# Patient Record
Sex: Female | Born: 1955
Health system: Southern US, Community
[De-identification: ages and names within clinical notes are randomized; demographics above are authoritative.]

## PROBLEM LIST (undated history)

## (undated) DIAGNOSIS — Z923 Personal history of irradiation: Secondary | ICD-10-CM

## (undated) DIAGNOSIS — I10 Essential (primary) hypertension: Secondary | ICD-10-CM

## (undated) DIAGNOSIS — C801 Malignant (primary) neoplasm, unspecified: Secondary | ICD-10-CM

## (undated) DIAGNOSIS — E785 Hyperlipidemia, unspecified: Secondary | ICD-10-CM

## (undated) HISTORY — DX: Malignant (primary) neoplasm, unspecified: C80.1

## (undated) HISTORY — PX: BREAST BIOPSY: SHX20

## (undated) HISTORY — PX: CHOLECYSTECTOMY: SHX55

## (undated) HISTORY — DX: Hyperlipidemia, unspecified: E78.5

## (undated) HISTORY — PX: BREAST SURGERY: SHX581

## (undated) HISTORY — DX: Essential (primary) hypertension: I10

---

## 1998-08-07 ENCOUNTER — Ambulatory Visit (HOSPITAL_COMMUNITY): Admission: RE | Admit: 1998-08-07 | Discharge: 1998-08-07 | Payer: Self-pay | Admitting: Obstetrics & Gynecology

## 1998-08-07 ENCOUNTER — Encounter: Payer: Self-pay | Admitting: Obstetrics & Gynecology

## 1998-09-05 DIAGNOSIS — C50919 Malignant neoplasm of unspecified site of unspecified female breast: Secondary | ICD-10-CM

## 1998-09-05 DIAGNOSIS — Z923 Personal history of irradiation: Secondary | ICD-10-CM

## 1998-09-05 HISTORY — DX: Personal history of irradiation: Z92.3

## 1998-09-05 HISTORY — DX: Malignant neoplasm of unspecified site of unspecified female breast: C50.919

## 1998-09-05 HISTORY — PX: BREAST LUMPECTOMY: SHX2

## 1998-10-03 ENCOUNTER — Other Ambulatory Visit: Admission: RE | Admit: 1998-10-03 | Discharge: 1998-10-03 | Payer: Self-pay | Admitting: General Surgery

## 1999-02-24 ENCOUNTER — Ambulatory Visit (HOSPITAL_COMMUNITY): Admission: RE | Admit: 1999-02-24 | Discharge: 1999-02-24 | Payer: Self-pay | Admitting: Obstetrics & Gynecology

## 1999-02-24 ENCOUNTER — Encounter: Payer: Self-pay | Admitting: Obstetrics & Gynecology

## 1999-03-23 ENCOUNTER — Ambulatory Visit (HOSPITAL_COMMUNITY): Admission: RE | Admit: 1999-03-23 | Discharge: 1999-03-23 | Payer: Self-pay | Admitting: General Surgery

## 1999-03-23 ENCOUNTER — Encounter: Payer: Self-pay | Admitting: General Surgery

## 1999-03-23 ENCOUNTER — Encounter (INDEPENDENT_AMBULATORY_CARE_PROVIDER_SITE_OTHER): Payer: Self-pay

## 1999-04-01 ENCOUNTER — Ambulatory Visit (HOSPITAL_BASED_OUTPATIENT_CLINIC_OR_DEPARTMENT_OTHER): Admission: RE | Admit: 1999-04-01 | Discharge: 1999-04-01 | Payer: Self-pay | Admitting: General Surgery

## 1999-04-01 ENCOUNTER — Encounter: Payer: Self-pay | Admitting: General Surgery

## 1999-04-20 ENCOUNTER — Encounter: Admission: RE | Admit: 1999-04-20 | Discharge: 1999-07-19 | Payer: Self-pay | Admitting: Radiation Oncology

## 1999-04-26 ENCOUNTER — Ambulatory Visit (HOSPITAL_COMMUNITY): Admission: RE | Admit: 1999-04-26 | Discharge: 1999-04-26 | Payer: Self-pay | Admitting: Radiation Oncology

## 1999-04-26 ENCOUNTER — Encounter: Payer: Self-pay | Admitting: General Surgery

## 1999-08-06 ENCOUNTER — Other Ambulatory Visit: Admission: RE | Admit: 1999-08-06 | Discharge: 1999-08-06 | Payer: Self-pay | Admitting: Obstetrics and Gynecology

## 1999-09-22 ENCOUNTER — Encounter: Payer: Self-pay | Admitting: General Surgery

## 1999-09-22 ENCOUNTER — Ambulatory Visit (HOSPITAL_COMMUNITY): Admission: RE | Admit: 1999-09-22 | Discharge: 1999-09-22 | Payer: Self-pay | Admitting: General Surgery

## 2000-02-28 ENCOUNTER — Encounter: Payer: Self-pay | Admitting: General Surgery

## 2000-02-28 ENCOUNTER — Encounter: Admission: RE | Admit: 2000-02-28 | Discharge: 2000-02-28 | Payer: Self-pay | Admitting: General Surgery

## 2000-09-12 ENCOUNTER — Encounter: Payer: Self-pay | Admitting: Obstetrics and Gynecology

## 2000-09-12 ENCOUNTER — Encounter: Admission: RE | Admit: 2000-09-12 | Discharge: 2000-09-12 | Payer: Self-pay | Admitting: *Deleted

## 2000-09-28 ENCOUNTER — Other Ambulatory Visit: Admission: RE | Admit: 2000-09-28 | Discharge: 2000-09-28 | Payer: Self-pay | Admitting: Obstetrics and Gynecology

## 2001-09-20 ENCOUNTER — Encounter: Payer: Self-pay | Admitting: Obstetrics and Gynecology

## 2001-09-20 ENCOUNTER — Encounter: Admission: RE | Admit: 2001-09-20 | Discharge: 2001-09-20 | Payer: Self-pay | Admitting: Obstetrics and Gynecology

## 2001-10-17 ENCOUNTER — Other Ambulatory Visit: Admission: RE | Admit: 2001-10-17 | Discharge: 2001-10-17 | Payer: Self-pay | Admitting: Obstetrics and Gynecology

## 2002-09-24 ENCOUNTER — Encounter: Admission: RE | Admit: 2002-09-24 | Discharge: 2002-09-24 | Payer: Self-pay | Admitting: Obstetrics and Gynecology

## 2002-09-24 ENCOUNTER — Encounter: Payer: Self-pay | Admitting: Obstetrics and Gynecology

## 2002-11-14 ENCOUNTER — Other Ambulatory Visit: Admission: RE | Admit: 2002-11-14 | Discharge: 2002-11-14 | Payer: Self-pay | Admitting: Obstetrics and Gynecology

## 2003-02-06 ENCOUNTER — Encounter: Payer: Self-pay | Admitting: Family Medicine

## 2003-02-06 ENCOUNTER — Encounter: Admission: RE | Admit: 2003-02-06 | Discharge: 2003-02-06 | Payer: Self-pay | Admitting: Family Medicine

## 2003-09-30 ENCOUNTER — Encounter: Admission: RE | Admit: 2003-09-30 | Discharge: 2003-09-30 | Payer: Self-pay | Admitting: General Surgery

## 2003-11-18 ENCOUNTER — Other Ambulatory Visit: Admission: RE | Admit: 2003-11-18 | Discharge: 2003-11-18 | Payer: Self-pay | Admitting: Obstetrics and Gynecology

## 2004-09-30 ENCOUNTER — Encounter: Admission: RE | Admit: 2004-09-30 | Discharge: 2004-09-30 | Payer: Self-pay | Admitting: Obstetrics and Gynecology

## 2004-11-30 ENCOUNTER — Other Ambulatory Visit: Admission: RE | Admit: 2004-11-30 | Discharge: 2004-11-30 | Payer: Self-pay | Admitting: Obstetrics and Gynecology

## 2005-02-28 ENCOUNTER — Ambulatory Visit: Payer: Self-pay | Admitting: Oncology

## 2005-10-04 ENCOUNTER — Encounter: Admission: RE | Admit: 2005-10-04 | Discharge: 2005-10-04 | Payer: Self-pay | Admitting: Oncology

## 2006-01-10 ENCOUNTER — Other Ambulatory Visit: Admission: RE | Admit: 2006-01-10 | Discharge: 2006-01-10 | Payer: Self-pay | Admitting: Obstetrics and Gynecology

## 2006-10-05 ENCOUNTER — Encounter: Admission: RE | Admit: 2006-10-05 | Discharge: 2006-10-05 | Payer: Self-pay | Admitting: Obstetrics and Gynecology

## 2006-11-02 ENCOUNTER — Encounter: Admission: RE | Admit: 2006-11-02 | Discharge: 2006-11-02 | Payer: Self-pay | Admitting: Obstetrics and Gynecology

## 2007-10-25 ENCOUNTER — Encounter: Admission: RE | Admit: 2007-10-25 | Discharge: 2007-10-25 | Payer: Self-pay | Admitting: Obstetrics and Gynecology

## 2007-11-09 ENCOUNTER — Encounter: Admission: RE | Admit: 2007-11-09 | Discharge: 2007-11-09 | Payer: Self-pay | Admitting: Obstetrics and Gynecology

## 2008-10-27 ENCOUNTER — Encounter: Admission: RE | Admit: 2008-10-27 | Discharge: 2008-10-27 | Payer: Self-pay | Admitting: Obstetrics and Gynecology

## 2009-07-02 ENCOUNTER — Encounter: Payer: Self-pay | Admitting: Family Medicine

## 2009-07-21 ENCOUNTER — Encounter (INDEPENDENT_AMBULATORY_CARE_PROVIDER_SITE_OTHER): Payer: Self-pay | Admitting: *Deleted

## 2009-07-21 LAB — CONVERTED CEMR LAB
ALT: 17 units/L
AST: 20 units/L
Albumin: 4.6 g/dL
Calcium: 9.8 mg/dL
Cholesterol: 204 mg/dL
Globulin: 2.7 g/dL
HCT: 42.9 %
HDL: 38 mg/dL
Platelets: 316 10*3/uL
Potassium, serum: 4.4 mmol/L
Total Protein: 7.3 g/dL
Triglycerides: 323 mg/dL
WBC, blood: 6 10*3/uL

## 2009-09-25 ENCOUNTER — Ambulatory Visit: Payer: Self-pay | Admitting: Family Medicine

## 2009-09-25 DIAGNOSIS — E7800 Pure hypercholesterolemia, unspecified: Secondary | ICD-10-CM | POA: Insufficient documentation

## 2009-09-25 DIAGNOSIS — I10 Essential (primary) hypertension: Secondary | ICD-10-CM | POA: Insufficient documentation

## 2009-09-25 DIAGNOSIS — D126 Benign neoplasm of colon, unspecified: Secondary | ICD-10-CM

## 2009-09-25 DIAGNOSIS — C50919 Malignant neoplasm of unspecified site of unspecified female breast: Secondary | ICD-10-CM

## 2009-09-25 DIAGNOSIS — E559 Vitamin D deficiency, unspecified: Secondary | ICD-10-CM

## 2009-10-29 ENCOUNTER — Encounter: Admission: RE | Admit: 2009-10-29 | Discharge: 2009-10-29 | Payer: Self-pay | Admitting: Obstetrics and Gynecology

## 2010-01-05 ENCOUNTER — Ambulatory Visit: Payer: Self-pay | Admitting: Family Medicine

## 2010-01-05 LAB — CONVERTED CEMR LAB
Direct LDL: 131.1 mg/dL
HDL: 35.5 mg/dL — ABNORMAL LOW (ref >39.00)

## 2010-04-14 ENCOUNTER — Ambulatory Visit: Payer: Self-pay | Admitting: Family Medicine

## 2010-04-19 LAB — CONVERTED CEMR LAB
Cholesterol: 194 mg/dL (ref 0–200)
Direct LDL: 129.2 mg/dL
HDL: 34.4 mg/dL — ABNORMAL LOW (ref >39.00)
Triglycerides: 262 mg/dL — ABNORMAL HIGH (ref 0.0–149.0)
VLDL: 52.4 mg/dL — ABNORMAL HIGH (ref 0.0–40.0)

## 2010-09-25 ENCOUNTER — Other Ambulatory Visit: Payer: Self-pay | Admitting: Obstetrics and Gynecology

## 2010-09-25 DIAGNOSIS — Z9889 Other specified postprocedural states: Secondary | ICD-10-CM

## 2010-09-26 ENCOUNTER — Encounter: Payer: Self-pay | Admitting: Obstetrics and Gynecology

## 2010-10-05 NOTE — Assessment & Plan Note (Signed)
Summary: 3 MTH FU/CLE   Vital Signs:  Patient profile:   55 year old female Height:      65 inches Weight:      193.4 pounds BMI:     32.30 Temp:     97.9 degrees F oral Pulse rate:   80 / minute Pulse rhythm:   regular BP sitting:   118 / 82  (left arm) Cuff size:   large  Vitals Entered By: Benny Lennert CMA Duncan Dull) (Jan 05, 2010 8:15 AM)  History of Present Illness: Chief complaint 3 month follow up  HTN, improved control..BP at home .Marland Kitchennot checking.  Increased exercsie. Improved diet. Having weigh ins at work...pays 1 dollar for weight gain. 9 lb weight loss.  Using fish oil two times a day.   Problems Prior to Update: 1)  Unspecified Vitamin D Deficiency  (ICD-268.9) 2)  Colonic Polyps  (ICD-211.3) 3)  Hypercholesterolemia  (ICD-272.0) 4)  Essential Hypertension, Benign  (ICD-401.1) 5)  Malig Neoplasm Other Spec Sites Female Breast  (ICD-174.8)  Current Medications (verified): 1)  Omega-3 350 Mg Caps (Omega-3 Fatty Acids) .... Daily 2)  Calcium 500 Mg Tabs (Calcium) .... Daily  Allergies (verified): No Known Drug Allergies  Past History:  Past medical, surgical, family and social histories (including risk factors) reviewed, and no changes noted (except as noted below).  Past Medical History: Reviewed history from 09/25/2009 and no changes required. Current Problems:  COLONIC POLYPS (ICD-211.3) HYPERCHOLESTEROLEMIA (ICD-272.0) ELEVATED BP READING WITHOUT DX HYPERTENSION (ICD-796.2) MALIG NEOPLASM OTHER SPEC SITES FEMALE BREAST (ICD-174.8): Dr. Derrell Lolling surgeon, ? ONC...s/p tamoxifen  Past Surgical History: Reviewed history from 09/25/2009 and no changes required. Gallbladder 1996 breast biopsy 2000 Right kidney surgery 1963, for partial neprectomy for blood vessel issue. right breast -fibroid cyst removed 1985 tubal ligation 1998 right breast cancer with partial lumpectomy with radiation 2000  Family History: Reviewed history from 09/25/2009 and no  changes required. Family History of Arthritis Family History High cholesterol Family History Hypertension Family History of Prostate CA 1st degree relative <50 Mother: prediabetes Father: deceased age 41.Marland KitchenHTN, high chol, prostate cancer Siblings : HTN  Social History: Reviewed history from 09/25/2009 and no changes required. Occupation: Attendance at [employer] Married Never Smoked Alcohol use-no Drug use-no Regular exercise-yes, walks at lunch   Diet: fruits and veggies.   Review of Systems General:  Denies fatigue and fever. CV:  Denies chest pain or discomfort. Resp:  Denies shortness of breath. GI:  Denies abdominal pain. GU:  Denies dysuria.  Physical Exam  General:  Well-developed,well-nourished,in no acute distress; alert,appropriate and cooperative throughout examination Ears:  External ear exam shows no significant lesions or deformities.  Otoscopic examination reveals clear canals, tympanic membranes are intact bilaterally without bulging, retraction, inflammation or discharge. Hearing is grossly normal bilaterally. Mouth:  Oral mucosa and oropharynx without lesions or exudates.  Teeth in good repair. Neck:  no carotid bruit or thyromegaly no cervical or supraclavicular lymphadenopathy  Lungs:  Normal respiratory effort, chest expands symmetrically. Lungs are clear to auscultation, no crackles or wheezes. Heart:  Normal rate and regular rhythm. S1 and S2 normal without gallop, murmur, click, rub or other extra sounds. Pulses:  R and L posterior tibial pulses are full and equal bilaterally  Extremities:  noedema Skin:  Intact without suspicious lesions or rashes   Impression & Recommendations:  Problem # 1:  ESSENTIAL HYPERTENSION, BENIGN (ICD-401.1) Improved. Continue lifestyle change. Follow at home. Call if BP greater than 140/90.   Problem #  2:  HYPERCHOLESTEROLEMIA (ICD-272.0) Due for recheck.  Orders: Venipuncture (52841) TLB-Lipid Panel  ([id number]-LIPID)  Complete Medication List: 1)  Omega-3 350 Mg Caps (Omega-3 fatty acids) .... Daily 2)  Calcium 500 Mg Tabs (Calcium) .... Daily  Patient Instructions: 1)  Please schedule a follow-up appointment in 6-8 months  HTN check.   Current Allergies (reviewed today): No known allergies

## 2010-10-05 NOTE — Assessment & Plan Note (Signed)
Summary: NEW PT TO EST/CLE   Vital Signs:  Patient profile:   55 year old female Height:      65 inches Weight:      202.2 pounds BMI:     33.77 Temp:     97.6 degrees F oral Pulse rate:   76 / minute Pulse rhythm:   regular BP sitting:   140 / 92  (left arm) Cuff size:   large  Vitals Entered By: Benny Lennert CMA Duncan Dull) (September 25, 2009 10:48 AM)  History of Present Illness: Chief complaint new patient to be established   Had been seeing Dr. Vergia Alberts in past.    HTN, inadequate control: new diagnosis.Marland Kitchennoted elevated  152/95at GYN in 06/2009 Nml creatinine, liver, TSH and cbc in 06/2009. no DM on labs as well.     Preventive Screening-Counseling & Management  Alcohol-Tobacco     Smoking Status: never  Caffeine-Diet-Exercise     Does Patient Exercise: yes      Drug Use:  no.    Current Medications (verified): 1)  None  Allergies (verified): No Known Drug Allergies  Past History:  Past Medical History: Current Problems:  COLONIC POLYPS (ICD-211.3) HYPERCHOLESTEROLEMIA (ICD-272.0) ELEVATED BP READING WITHOUT DX HYPERTENSION (ICD-796.2) MALIG NEOPLASM OTHER SPEC SITES FEMALE BREAST (ICD-174.8): Dr. Derrell Lolling surgeon, ? ONC...s/p tamoxifen  Past Surgical History: Gallbladder 1996 breast biopsy 2000 Right kidney surgery 1963, for partial neprectomy for blood vessel issue. right breast -fibroid cyst removed 1985 tubal ligation 1998 right breast cancer with partial lumpectomy with radiation 2000  Family History: Family History of Arthritis Family History High cholesterol Family History Hypertension Family History of Prostate CA 1st degree relative <50 Mother: prediabetes Father: deceased age 24.Marland KitchenHTN, high chol, prostate cancer Siblings : HTN  Social History: Occupation: Attendance at [employer] Married Never Smoked Alcohol use-no Drug use-no Regular exercise-yes, walks at lunch   Diet: fruits and veggies.  Occupation:   employed Smoking Status:  never Drug Use:  no Does Patient Exercise:  yes  Review of Systems       ? snores..no known apnea spells General:  Denies fatigue and fever. CV:  Denies chest pain or discomfort. Resp:  Denies cough and shortness of breath. GI:  Denies bloody stools, constipation, and diarrhea. Psych:  Denies anxiety and depression. Endo:  Denies excessive thirst and weight change.  Physical Exam  General:  obese appearing female inNAd Eyes:  No corneal or conjunctival inflammation noted. EOMI. Perrla. Funduscopic exam benign, without hemorrhages, exudates or papilledema. Vision grossly normal. Ears:  External ear exam shows no significant lesions or deformities.  Otoscopic examination reveals clear canals, tympanic membranes are intact bilaterally without bulging, retraction, inflammation or discharge. Hearing is grossly normal bilaterally. Nose:  External nasal examination shows no deformity or inflammation. Nasal mucosa are pink and moist without lesions or exudates. Mouth:  MMM, samll oropharynx Neck:  no carotid bruit or thyromegaly no cervical or supraclavicular lymphadenopathy  Breasts:  per GYN Lungs:  Normal respiratory effort, chest expands symmetrically. Lungs are clear to auscultation, no crackles or wheezes. Heart:  Normal rate and regular rhythm. S1 and S2 normal without gallop, murmur, click, rub or other extra sounds. Abdomen:  Bowel sounds positive,abdomen soft and non-tender without masses, organomegaly or hernias noted. Genitalia:  per GYN Msk:  No deformity or scoliosis noted of thoracic or lumbar spine.   Pulses:  R and L posterior tibial pulses are full and equal bilaterally  Extremities:  noedema Skin:  Intact without suspicious  lesions or rashes Psych:  Cognition and judgment appear intact. Alert and cooperative with normal attention span and concentration. No apparent delusions, illusions, hallucinations   Impression & Recommendations:  Problem #  1:  HYPERCHOLESTEROLEMIA (ICD-272.0) Triglycerides high..will work on diet changes, exercise and weight loss. Info given.  Problem # 2:  ESSENTIAL HYPERTENSION, BENIGN (ICD-401.1) Assessment: New New diagnosis. Consider sleep apnea eval in future. Wants to try diet changes prior to med initiation. Needs EKG next appt.  UA and lab profile for secondary causes neg at GYN..reviewed in detail.   Patient Instructions: 1)  Increase fish oil to 1000 mg two times a day. 2)   Lipid panel  in 3 months. fasting prior to 3 month follow up 3)  Follow BP at home.Marland Kitchengoal <140/90. 4)  Low salt diet, low fat diet. 5)  Regular exercsie. 6)  Please schedule a follow-up appointment in 3 months .   Flu Vaccine Next Due:  Refused TD Result Date:  09/05/2006 TD Result:  given TD Next Due:  10 yr Flex Sig Next Due:  Not Indicated Colonoscopy Result Date:  03/13/2008 Colonoscopy Result:  polyps, begin Colonoscopy Next Due:  5 yr Hemoccult Next Due:  Not Indicated Mammogram Result Date:  10/06/2008 Mammogram Result:  normal Mammogram Next Due:  1 yr Sees GYN Dr. Senaida Ores GI Dr. Kinnie Scales

## 2010-11-01 ENCOUNTER — Ambulatory Visit
Admission: RE | Admit: 2010-11-01 | Discharge: 2010-11-01 | Disposition: A | Discharge status: Home / Self Care | Payer: 59 | Source: Ambulatory Visit | Attending: Obstetrics and Gynecology | Admitting: Obstetrics and Gynecology

## 2010-11-01 DIAGNOSIS — Z9889 Other specified postprocedural states: Secondary | ICD-10-CM

## 2011-04-25 ENCOUNTER — Encounter: Payer: Self-pay | Admitting: Family Medicine

## 2011-09-22 ENCOUNTER — Other Ambulatory Visit: Payer: Self-pay | Admitting: Obstetrics and Gynecology

## 2011-09-22 DIAGNOSIS — Z1231 Encounter for screening mammogram for malignant neoplasm of breast: Secondary | ICD-10-CM

## 2011-11-16 ENCOUNTER — Ambulatory Visit
Admission: RE | Admit: 2011-11-16 | Discharge: 2011-11-16 | Disposition: A | Discharge status: Home / Self Care | Payer: 59 | Source: Ambulatory Visit | Attending: Obstetrics and Gynecology | Admitting: Obstetrics and Gynecology

## 2011-11-16 DIAGNOSIS — Z1231 Encounter for screening mammogram for malignant neoplasm of breast: Secondary | ICD-10-CM

## 2011-11-21 ENCOUNTER — Other Ambulatory Visit: Payer: Self-pay | Admitting: Obstetrics and Gynecology

## 2011-11-21 DIAGNOSIS — R928 Other abnormal and inconclusive findings on diagnostic imaging of breast: Secondary | ICD-10-CM

## 2011-11-28 ENCOUNTER — Ambulatory Visit
Admission: RE | Admit: 2011-11-28 | Discharge: 2011-11-28 | Disposition: A | Discharge status: Home / Self Care | Payer: 59 | Source: Ambulatory Visit | Attending: Obstetrics and Gynecology | Admitting: Obstetrics and Gynecology

## 2011-11-28 ENCOUNTER — Other Ambulatory Visit: Payer: Self-pay | Admitting: Obstetrics and Gynecology

## 2011-11-28 DIAGNOSIS — R928 Other abnormal and inconclusive findings on diagnostic imaging of breast: Secondary | ICD-10-CM

## 2012-04-26 ENCOUNTER — Telehealth: Payer: Self-pay

## 2012-04-26 NOTE — Telephone Encounter (Signed)
Pt's daughter is not [place] pt; pt goes to [facility] if sick. Pt's daughter who is 66 is in [place] working and when she woke up this morning there was a bat flying around her. Pt's daughter went to doctor in [place] and was advised since exposed to bat but no obvious bite mark suggested pt get rabies treatment. Pts daughter to fly back here 04/27/12. Jadasia wants Dr Daphine Deutscher advise of where she should take her daughter; Lyla Son told [facility] no new patient openings available right away.

## 2012-04-26 NOTE — Telephone Encounter (Signed)
Called patient and left a message on machine:  Advised that standard of care when bat unable to be captured and sent to pathology is for urgent immunoglobulin, followed by rabies vaccination. The only facility that does rabies vaccination follow-up in [facility] is [facility] - and immunoglobulin is given at the ER only in [place].  Relayed that Dr. B back first thing in AM if questions.   Hannah Beat, MD 04/26/2012, 4:55 PM

## 2012-10-31 ENCOUNTER — Other Ambulatory Visit: Payer: Self-pay | Admitting: Obstetrics and Gynecology

## 2012-10-31 ENCOUNTER — Other Ambulatory Visit: Payer: Self-pay

## 2012-10-31 DIAGNOSIS — Z1231 Encounter for screening mammogram for malignant neoplasm of breast: Secondary | ICD-10-CM

## 2012-10-31 DIAGNOSIS — Z780 Asymptomatic menopausal state: Secondary | ICD-10-CM

## 2012-12-18 ENCOUNTER — Ambulatory Visit
Admission: RE | Admit: 2012-12-18 | Discharge: 2012-12-18 | Disposition: A | Discharge status: Home / Self Care | Payer: BC Managed Care – PPO | Source: Ambulatory Visit

## 2012-12-18 ENCOUNTER — Ambulatory Visit
Admission: RE | Admit: 2012-12-18 | Discharge: 2012-12-18 | Disposition: A | Discharge status: Home / Self Care | Payer: BC Managed Care – PPO | Source: Ambulatory Visit | Attending: Obstetrics and Gynecology | Admitting: Obstetrics and Gynecology

## 2012-12-18 DIAGNOSIS — Z780 Asymptomatic menopausal state: Secondary | ICD-10-CM

## 2012-12-18 DIAGNOSIS — Z1231 Encounter for screening mammogram for malignant neoplasm of breast: Secondary | ICD-10-CM

## 2012-12-19 ENCOUNTER — Ambulatory Visit
Admission: RE | Admit: 2012-12-19 | Discharge: 2012-12-19 | Disposition: A | Discharge status: Home / Self Care | Payer: BC Managed Care – PPO | Source: Ambulatory Visit | Attending: Obstetrics and Gynecology | Admitting: Obstetrics and Gynecology

## 2012-12-19 ENCOUNTER — Other Ambulatory Visit: Payer: Self-pay | Admitting: Obstetrics and Gynecology

## 2012-12-19 DIAGNOSIS — R928 Other abnormal and inconclusive findings on diagnostic imaging of breast: Secondary | ICD-10-CM

## 2012-12-26 ENCOUNTER — Ambulatory Visit
Admission: RE | Admit: 2012-12-26 | Discharge: 2012-12-26 | Disposition: A | Discharge status: Home / Self Care | Payer: BC Managed Care – PPO | Source: Ambulatory Visit | Attending: Obstetrics and Gynecology | Admitting: Obstetrics and Gynecology

## 2012-12-26 ENCOUNTER — Other Ambulatory Visit: Payer: Self-pay | Admitting: Obstetrics and Gynecology

## 2012-12-26 DIAGNOSIS — R928 Other abnormal and inconclusive findings on diagnostic imaging of breast: Secondary | ICD-10-CM

## 2013-11-11 ENCOUNTER — Other Ambulatory Visit: Payer: Self-pay

## 2013-11-11 DIAGNOSIS — Z1231 Encounter for screening mammogram for malignant neoplasm of breast: Secondary | ICD-10-CM

## 2013-11-11 DIAGNOSIS — Z9889 Other specified postprocedural states: Secondary | ICD-10-CM

## 2013-12-23 ENCOUNTER — Ambulatory Visit
Admission: RE | Admit: 2013-12-23 | Discharge: 2013-12-23 | Disposition: A | Discharge status: Home / Self Care | Payer: BC Managed Care – PPO | Source: Ambulatory Visit

## 2013-12-23 DIAGNOSIS — Z9889 Other specified postprocedural states: Secondary | ICD-10-CM

## 2013-12-23 DIAGNOSIS — Z1231 Encounter for screening mammogram for malignant neoplasm of breast: Secondary | ICD-10-CM

## 2013-12-26 ENCOUNTER — Other Ambulatory Visit: Payer: Self-pay | Admitting: Obstetrics and Gynecology

## 2013-12-26 DIAGNOSIS — R928 Other abnormal and inconclusive findings on diagnostic imaging of breast: Secondary | ICD-10-CM

## 2014-01-07 ENCOUNTER — Other Ambulatory Visit: Payer: Self-pay | Admitting: Obstetrics and Gynecology

## 2014-01-07 ENCOUNTER — Ambulatory Visit
Admission: RE | Admit: 2014-01-07 | Discharge: 2014-01-07 | Disposition: A | Discharge status: Home / Self Care | Payer: BC Managed Care – PPO | Source: Ambulatory Visit | Attending: Obstetrics and Gynecology | Admitting: Obstetrics and Gynecology

## 2014-01-07 ENCOUNTER — Encounter (INDEPENDENT_AMBULATORY_CARE_PROVIDER_SITE_OTHER): Payer: Self-pay

## 2014-01-07 DIAGNOSIS — R921 Mammographic calcification found on diagnostic imaging of breast: Secondary | ICD-10-CM

## 2014-01-07 DIAGNOSIS — R928 Other abnormal and inconclusive findings on diagnostic imaging of breast: Secondary | ICD-10-CM

## 2014-01-13 ENCOUNTER — Ambulatory Visit
Admission: RE | Admit: 2014-01-13 | Discharge: 2014-01-13 | Disposition: A | Discharge status: Home / Self Care | Payer: BC Managed Care – PPO | Source: Ambulatory Visit | Attending: Obstetrics and Gynecology | Admitting: Obstetrics and Gynecology

## 2014-01-13 DIAGNOSIS — R921 Mammographic calcification found on diagnostic imaging of breast: Secondary | ICD-10-CM

## 2014-11-12 ENCOUNTER — Other Ambulatory Visit: Payer: Self-pay

## 2014-11-12 DIAGNOSIS — Z1231 Encounter for screening mammogram for malignant neoplasm of breast: Secondary | ICD-10-CM

## 2014-11-12 DIAGNOSIS — Z853 Personal history of malignant neoplasm of breast: Secondary | ICD-10-CM

## 2014-11-12 DIAGNOSIS — Z9889 Other specified postprocedural states: Secondary | ICD-10-CM

## 2014-12-02 ENCOUNTER — Encounter: Payer: Self-pay | Admitting: Family Medicine

## 2014-12-02 ENCOUNTER — Ambulatory Visit (INDEPENDENT_AMBULATORY_CARE_PROVIDER_SITE_OTHER): Payer: BC Managed Care – PPO | Admitting: Family Medicine

## 2014-12-02 ENCOUNTER — Encounter (INDEPENDENT_AMBULATORY_CARE_PROVIDER_SITE_OTHER): Payer: Self-pay

## 2014-12-02 ENCOUNTER — Telehealth: Payer: Self-pay | Admitting: Family Medicine

## 2014-12-02 VITALS — BP 146/92 | HR 76 | Temp 98.1°F | Resp 18 | Ht 65.0 in | Wt 196.4 lb

## 2014-12-02 DIAGNOSIS — E559 Vitamin D deficiency, unspecified: Secondary | ICD-10-CM | POA: Diagnosis not present

## 2014-12-02 DIAGNOSIS — E7800 Pure hypercholesterolemia, unspecified: Secondary | ICD-10-CM

## 2014-12-02 DIAGNOSIS — E669 Obesity, unspecified: Secondary | ICD-10-CM

## 2014-12-02 DIAGNOSIS — R7303 Prediabetes: Secondary | ICD-10-CM | POA: Insufficient documentation

## 2014-12-02 DIAGNOSIS — I10 Essential (primary) hypertension: Secondary | ICD-10-CM | POA: Diagnosis not present

## 2014-12-02 DIAGNOSIS — C50911 Malignant neoplasm of unspecified site of right female breast: Secondary | ICD-10-CM

## 2014-12-02 LAB — COMPREHENSIVE METABOLIC PANEL
ALBUMIN: 4.4 g/dL (ref 3.5–5.2)
ALK PHOS: 76 U/L (ref 39–117)
ALT: 15 U/L (ref 0–35)
AST: 15 U/L (ref 0–37)
BUN: 19 mg/dL (ref 6–23)
CHLORIDE: 103 meq/L (ref 96–112)
CO2: 29 mEq/L (ref 19–32)
Calcium: 10.1 mg/dL (ref 8.4–10.5)
Creatinine, Ser: 0.87 mg/dL (ref 0.40–1.20)
GFR: 70.89 mL/min (ref >60.00)
GLUCOSE: 105 mg/dL — AB (ref 70–99)
POTASSIUM: 4.6 meq/L (ref 3.5–5.1)
SODIUM: 138 meq/L (ref 135–145)
TOTAL PROTEIN: 7.7 g/dL (ref 6.0–8.3)
Total Bilirubin: 0.4 mg/dL (ref 0.2–1.2)

## 2014-12-02 LAB — LIPID PANEL
CHOL/HDL RATIO: 5
CHOLESTEROL: 209 mg/dL — AB (ref 0–200)
HDL: 40.7 mg/dL (ref >39.00)
NONHDL: 168.3
TRIGLYCERIDES: 303 mg/dL — AB (ref 0.0–149.0)
VLDL: 60.6 mg/dL — AB (ref 0.0–40.0)

## 2014-12-02 LAB — LDL CHOLESTEROL, DIRECT: LDL DIRECT: 132 mg/dL

## 2014-12-02 LAB — VITAMIN D 25 HYDROXY (VIT D DEFICIENCY, FRACTURES): VITD: 27.18 ng/mL — ABNORMAL LOW (ref 30.00–100.00)

## 2014-12-02 NOTE — Telephone Encounter (Signed)
emmi mailed  °

## 2014-12-02 NOTE — Assessment & Plan Note (Signed)
Due for re-eval. 

## 2014-12-02 NOTE — Addendum Note (Signed)
Addended by: Eliezer Lofts E on: 12/02/2014 09:18 AM   Modules accepted: Orders

## 2014-12-02 NOTE — Progress Notes (Signed)
Pre visit review using our clinic review tool, if applicable. No additional management support is needed unless otherwise documented below in the visit note. 

## 2014-12-02 NOTE — Progress Notes (Signed)
Subjective:    Patient ID: Nicole Meyers, female    DOB: 1956/03/02, 59 y.o.   MRN: [id number]  HPI 59 year old female with history of HTN and high cholesterol presents to establish.  Doing well overall.   Sees GYN, Dr. Marvel Plan 02/2014  Mammo scheduled 12/31/2014  Colonoscopy 2013, Dr. Earlean Shawl, repeat in 5 years.  Hypertension:   Never been on med in past BP Readings from Last 3 Encounters:  12/02/14 146/92  01/05/10 118/82  09/25/09 140/92  Using medication without problems or lightheadedness: None Chest pain with exertion:None Edema:None Short of breath:None Average home BPs: not checking at home. Other issues:  Wt Readings from Last 3 Encounters:  12/02/14 196 lb 6.4 oz (89.086 kg)  01/05/10 193 lb 6.4 oz (87.726 kg)  09/25/09 202 lb 3.2 oz (91.717 kg)   Body mass index is 32.68 kg/(m^2).    Elevated Cholesterol: Never been on med in past.  Likely checked last at GYN. Using medications without problems:on none Diet compliance:Good Exercise: Rare. Other complaints:  History   Social History  . Marital Status: Married    Spouse Name: N/A  . Number of Children: N/A  . Years of Education: N/A   Social History Main Topics  . Smoking status: Never Smoker   . Smokeless tobacco: Not on file  . Alcohol Use: 0.6 oz/week    1 Standard drinks or equivalent per week  . Drug Use: No  . Sexual Activity:    Partners: Male   Other Topics Concern  . None   Social History Narrative    Family History  Problem Relation Age of Onset  . Hypertension Father   . Cancer Father 30    prostate  . Diabetes Mother   . Hypertension Sister   . Hypertension Brother   . Hypertension Sister   . Hypertension Sister   . Hypertension Brother   . Diabetes Maternal Grandmother         Review of Systems  Constitutional: Negative for fever, fatigue and unexpected weight change.  HENT: Negative for congestion, ear pain, sinus pressure, sneezing, sore throat and trouble  swallowing.   Eyes: Negative for pain and itching.  Respiratory: Negative for cough, shortness of breath and wheezing.   Cardiovascular: Negative for chest pain, palpitations and leg swelling.  Gastrointestinal: Negative for nausea, abdominal pain, diarrhea, constipation and blood in stool.  Genitourinary: Negative for dysuria, hematuria, vaginal discharge and difficulty urinating.  Skin: Negative for rash.  Neurological: Negative for syncope, weakness, light-headedness, numbness and headaches.  Psychiatric/Behavioral: Negative for confusion and dysphoric mood. The patient is not nervous/anxious.        Objective:   Physical Exam  Constitutional: Vital signs are normal. She appears well-developed and well-nourished. She is cooperative.  Non-toxic appearance. She does not appear ill. No distress.  HENT:  Head: Normocephalic.  Right Ear: Hearing, tympanic membrane, external ear and ear canal normal.  Left Ear: Hearing, tympanic membrane, external ear and ear canal normal.  Nose: Nose normal.  Eyes: Conjunctivae, EOM and lids are normal. Pupils are equal, round, and reactive to light. Lids are everted and swept, no foreign bodies found.  Neck: Trachea normal and normal range of motion. Neck supple. Carotid bruit is not present. No thyroid mass and no thyromegaly present.  Cardiovascular: Normal rate, regular rhythm, S1 normal, S2 normal, normal heart sounds and intact distal pulses.  Exam reveals no gallop.   No murmur heard. Pulmonary/Chest: Effort normal and breath sounds normal.  No respiratory distress. She has no wheezes. She has no rhonchi. She has no rales.  Abdominal: Soft. Normal appearance and bowel sounds are normal. She exhibits no distension, no fluid wave, no abdominal bruit and no mass. There is no hepatosplenomegaly. There is no tenderness. There is no rebound, no guarding and no CVA tenderness. No hernia.  Lymphadenopathy:    She has no cervical adenopathy.    She has no  axillary adenopathy.  Neurological: She is alert. She has normal strength. No cranial nerve deficit or sensory deficit.  Skin: Skin is warm, dry and intact. No rash noted.  Psychiatric: Her speech is normal and behavior is normal. Judgment normal. Her mood appears not anxious. Cognition and memory are normal. She does not exhibit a depressed mood.          Assessment & Plan:

## 2014-12-02 NOTE — Patient Instructions (Addendum)
Follow blood pressure at home.. Goal < 140/90. Call if > 3 measurements above goal.  Work on healthy eating, weight loss and regular exercise. Stop at lab on way out.

## 2014-12-02 NOTE — Assessment & Plan Note (Signed)
Will work on healthy eating, weight loss and lifestyle cahnge. Follow at home, may need BP med if not to goal.

## 2014-12-03 ENCOUNTER — Encounter: Payer: Self-pay | Admitting: *Deleted

## 2014-12-31 ENCOUNTER — Ambulatory Visit
Admission: RE | Admit: 2014-12-31 | Discharge: 2014-12-31 | Disposition: A | Discharge status: Home / Self Care | Payer: BC Managed Care – PPO | Source: Ambulatory Visit

## 2014-12-31 DIAGNOSIS — Z1231 Encounter for screening mammogram for malignant neoplasm of breast: Secondary | ICD-10-CM

## 2014-12-31 DIAGNOSIS — Z853 Personal history of malignant neoplasm of breast: Secondary | ICD-10-CM

## 2014-12-31 DIAGNOSIS — Z9889 Other specified postprocedural states: Secondary | ICD-10-CM

## 2015-11-16 ENCOUNTER — Other Ambulatory Visit: Payer: Self-pay

## 2015-11-16 DIAGNOSIS — Z1231 Encounter for screening mammogram for malignant neoplasm of breast: Secondary | ICD-10-CM

## 2016-01-04 ENCOUNTER — Ambulatory Visit
Admission: RE | Admit: 2016-01-04 | Discharge: 2016-01-04 | Disposition: A | Discharge status: Home / Self Care | Payer: BC Managed Care – PPO | Source: Ambulatory Visit

## 2016-01-04 DIAGNOSIS — Z1231 Encounter for screening mammogram for malignant neoplasm of breast: Secondary | ICD-10-CM

## 2016-09-27 ENCOUNTER — Encounter: Payer: Self-pay | Admitting: Family Medicine

## 2016-11-09 ENCOUNTER — Other Ambulatory Visit: Payer: Self-pay | Admitting: Obstetrics and Gynecology

## 2016-11-09 DIAGNOSIS — Z1231 Encounter for screening mammogram for malignant neoplasm of breast: Secondary | ICD-10-CM

## 2017-01-09 ENCOUNTER — Ambulatory Visit
Admission: RE | Admit: 2017-01-09 | Discharge: 2017-01-09 | Disposition: A | Discharge status: Home / Self Care | Payer: BC Managed Care – PPO | Source: Ambulatory Visit | Attending: Obstetrics and Gynecology | Admitting: Obstetrics and Gynecology

## 2017-01-09 DIAGNOSIS — Z1231 Encounter for screening mammogram for malignant neoplasm of breast: Secondary | ICD-10-CM

## 2017-01-09 HISTORY — DX: Personal history of irradiation: Z92.3

## 2017-12-01 ENCOUNTER — Other Ambulatory Visit: Payer: Self-pay | Admitting: Obstetrics and Gynecology

## 2017-12-01 DIAGNOSIS — Z139 Encounter for screening, unspecified: Secondary | ICD-10-CM

## 2018-01-10 ENCOUNTER — Ambulatory Visit
Admission: RE | Admit: 2018-01-10 | Discharge: 2018-01-10 | Disposition: A | Discharge status: Home / Self Care | Payer: BC Managed Care – PPO | Source: Ambulatory Visit | Attending: Obstetrics and Gynecology | Admitting: Obstetrics and Gynecology

## 2018-01-10 DIAGNOSIS — Z139 Encounter for screening, unspecified: Secondary | ICD-10-CM

## 2018-10-29 ENCOUNTER — Other Ambulatory Visit: Payer: Self-pay | Admitting: Obstetrics and Gynecology

## 2018-10-29 DIAGNOSIS — Z1231 Encounter for screening mammogram for malignant neoplasm of breast: Secondary | ICD-10-CM

## 2019-01-14 ENCOUNTER — Ambulatory Visit: Payer: BC Managed Care – PPO

## 2019-02-04 ENCOUNTER — Other Ambulatory Visit: Payer: Self-pay

## 2019-02-04 ENCOUNTER — Ambulatory Visit
Admission: RE | Admit: 2019-02-04 | Discharge: 2019-02-04 | Disposition: A | Discharge status: Home / Self Care | Payer: BC Managed Care – PPO | Source: Ambulatory Visit | Attending: Obstetrics and Gynecology | Admitting: Obstetrics and Gynecology

## 2019-02-04 DIAGNOSIS — Z1231 Encounter for screening mammogram for malignant neoplasm of breast: Secondary | ICD-10-CM

## 2019-02-05 ENCOUNTER — Other Ambulatory Visit: Payer: Self-pay | Admitting: Obstetrics and Gynecology

## 2019-02-06 ENCOUNTER — Other Ambulatory Visit: Payer: Self-pay | Admitting: Obstetrics and Gynecology

## 2019-02-06 DIAGNOSIS — R928 Other abnormal and inconclusive findings on diagnostic imaging of breast: Secondary | ICD-10-CM

## 2019-02-18 ENCOUNTER — Other Ambulatory Visit: Payer: Self-pay | Admitting: Obstetrics and Gynecology

## 2019-02-18 ENCOUNTER — Other Ambulatory Visit: Payer: Self-pay

## 2019-02-18 ENCOUNTER — Ambulatory Visit
Admission: RE | Admit: 2019-02-18 | Discharge: 2019-02-18 | Disposition: A | Discharge status: Home / Self Care | Payer: BC Managed Care – PPO | Source: Ambulatory Visit | Attending: Obstetrics and Gynecology | Admitting: Obstetrics and Gynecology

## 2019-02-18 DIAGNOSIS — R928 Other abnormal and inconclusive findings on diagnostic imaging of breast: Secondary | ICD-10-CM

## 2019-05-09 ENCOUNTER — Other Ambulatory Visit: Payer: Self-pay | Admitting: Obstetrics and Gynecology

## 2019-05-09 DIAGNOSIS — E2839 Other primary ovarian failure: Secondary | ICD-10-CM

## 2019-05-14 ENCOUNTER — Other Ambulatory Visit: Payer: BC Managed Care – PPO

## 2019-05-21 ENCOUNTER — Other Ambulatory Visit: Payer: BC Managed Care – PPO

## 2019-05-23 ENCOUNTER — Other Ambulatory Visit: Payer: BC Managed Care – PPO

## 2019-05-23 ENCOUNTER — Ambulatory Visit
Admission: RE | Admit: 2019-05-23 | Discharge: 2019-05-23 | Disposition: A | Discharge status: Home / Self Care | Payer: BC Managed Care – PPO | Source: Ambulatory Visit | Attending: Obstetrics and Gynecology | Admitting: Obstetrics and Gynecology

## 2019-05-23 ENCOUNTER — Other Ambulatory Visit: Payer: Self-pay

## 2019-05-23 DIAGNOSIS — R928 Other abnormal and inconclusive findings on diagnostic imaging of breast: Secondary | ICD-10-CM

## 2019-07-09 ENCOUNTER — Other Ambulatory Visit: Payer: Self-pay

## 2019-07-09 ENCOUNTER — Ambulatory Visit
Admission: RE | Admit: 2019-07-09 | Discharge: 2019-07-09 | Disposition: A | Discharge status: Home / Self Care | Payer: BC Managed Care – PPO | Source: Ambulatory Visit | Attending: Obstetrics and Gynecology | Admitting: Obstetrics and Gynecology

## 2019-07-09 DIAGNOSIS — E2839 Other primary ovarian failure: Secondary | ICD-10-CM

## 2019-12-27 ENCOUNTER — Other Ambulatory Visit: Payer: Self-pay | Admitting: Obstetrics and Gynecology

## 2019-12-27 DIAGNOSIS — Z1231 Encounter for screening mammogram for malignant neoplasm of breast: Secondary | ICD-10-CM

## 2020-02-10 ENCOUNTER — Other Ambulatory Visit: Payer: Self-pay

## 2020-02-10 ENCOUNTER — Ambulatory Visit
Admission: RE | Admit: 2020-02-10 | Discharge: 2020-02-10 | Disposition: A | Discharge status: Home / Self Care | Payer: BC Managed Care – PPO | Source: Ambulatory Visit | Attending: Obstetrics and Gynecology | Admitting: Obstetrics and Gynecology

## 2020-02-10 DIAGNOSIS — Z1231 Encounter for screening mammogram for malignant neoplasm of breast: Secondary | ICD-10-CM

## 2020-11-02 IMAGING — MG DIGITAL SCREENING BILAT W/ TOMO W/ CAD
8 series · 8 of 24 positions shown · non-contrast
Comparison: Previous exam(s).

CLINICAL DATA: Screening. Prior right lumpectomy 7555.

EXAM:
DIGITAL SCREENING BILATERAL MAMMOGRAM WITH TOMO AND CAD

[L CC synth-2D]
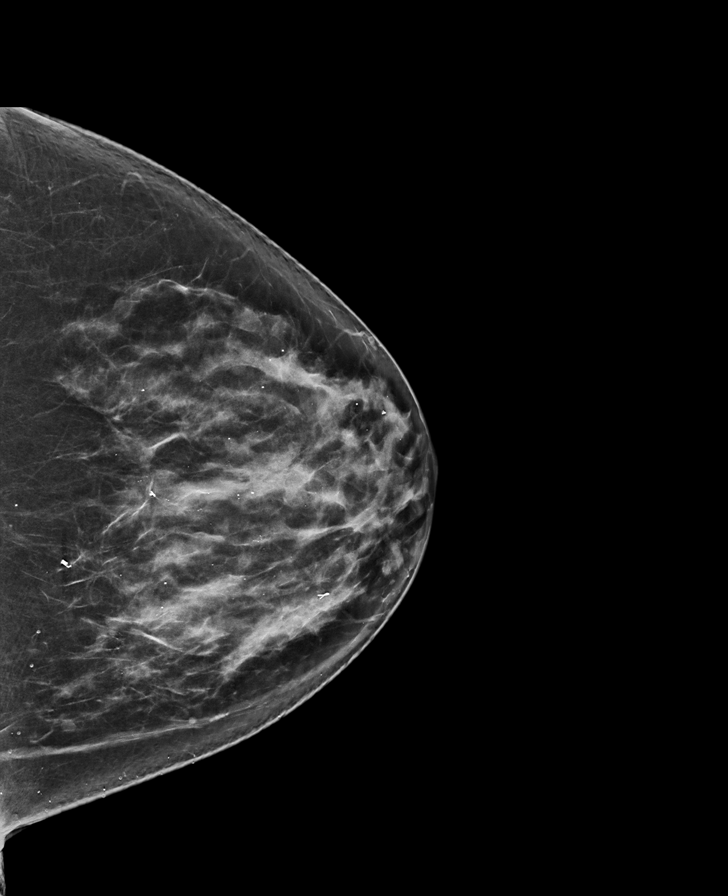

[L MLO synth-2D]
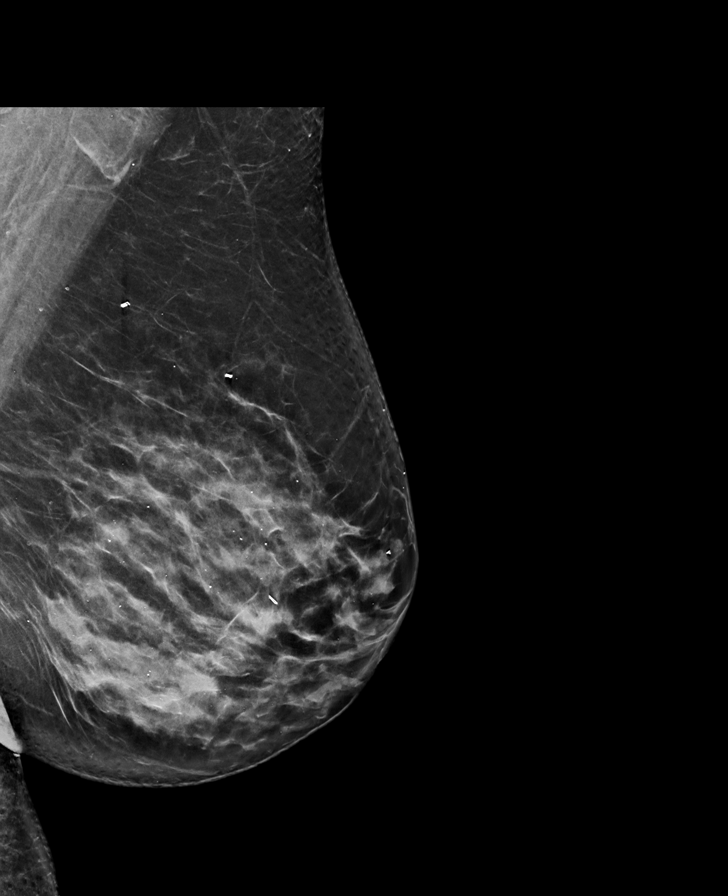

[R CC synth-2D]
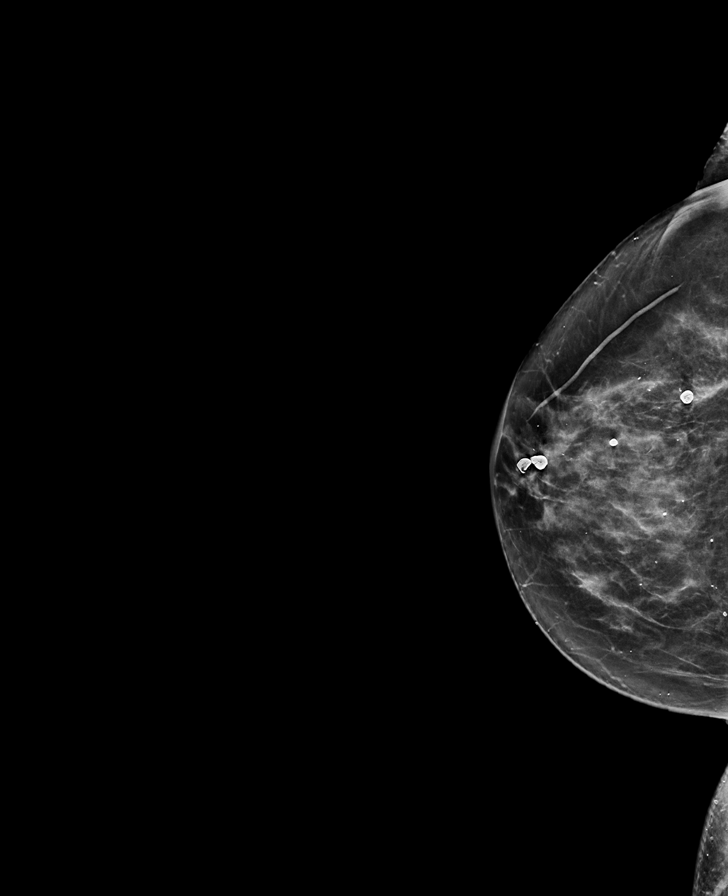

[R MLO synth-2D]
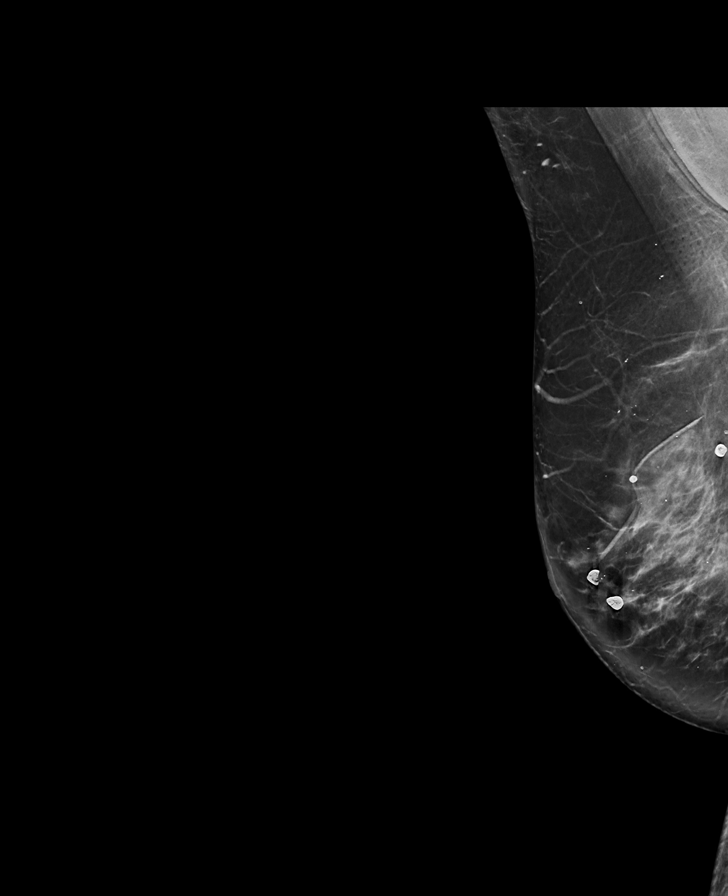

[L CC tomo · tomo slice 40/79.0]
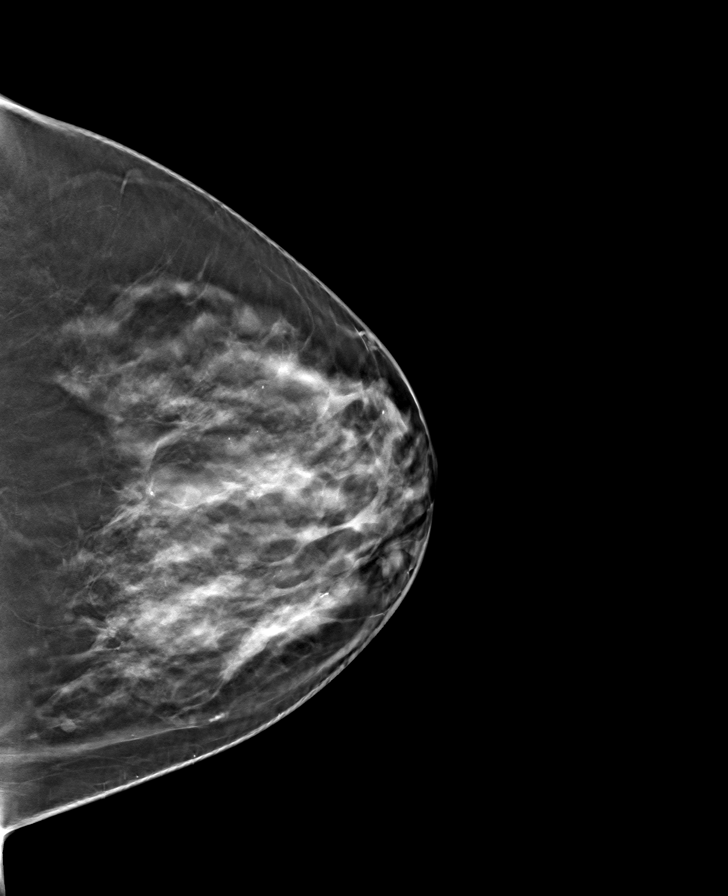

[R CC tomo · tomo slice 33/65.0]
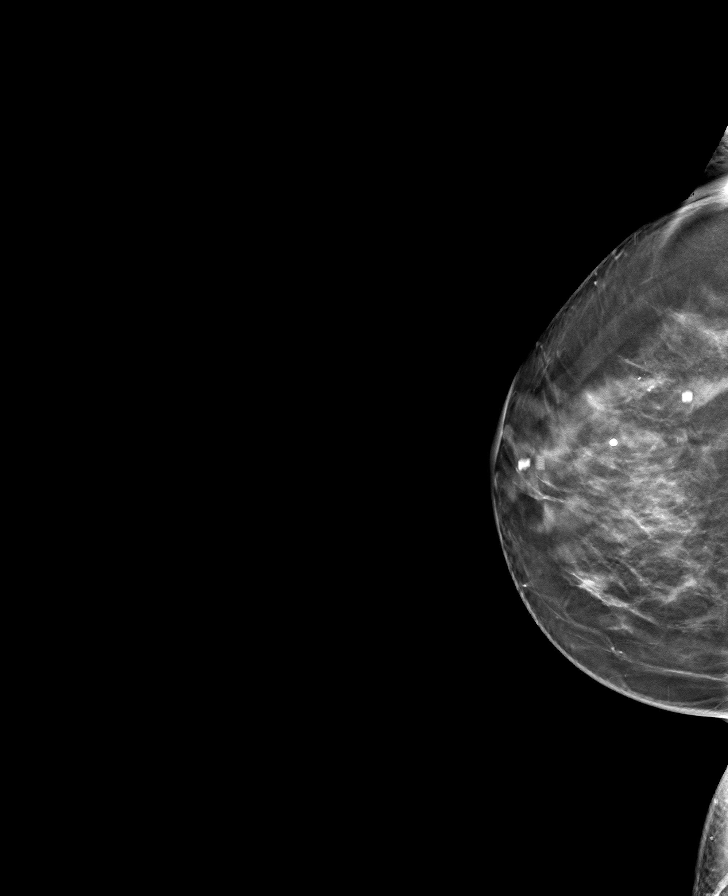

[R MLO tomo · tomo slice 39/77.0]
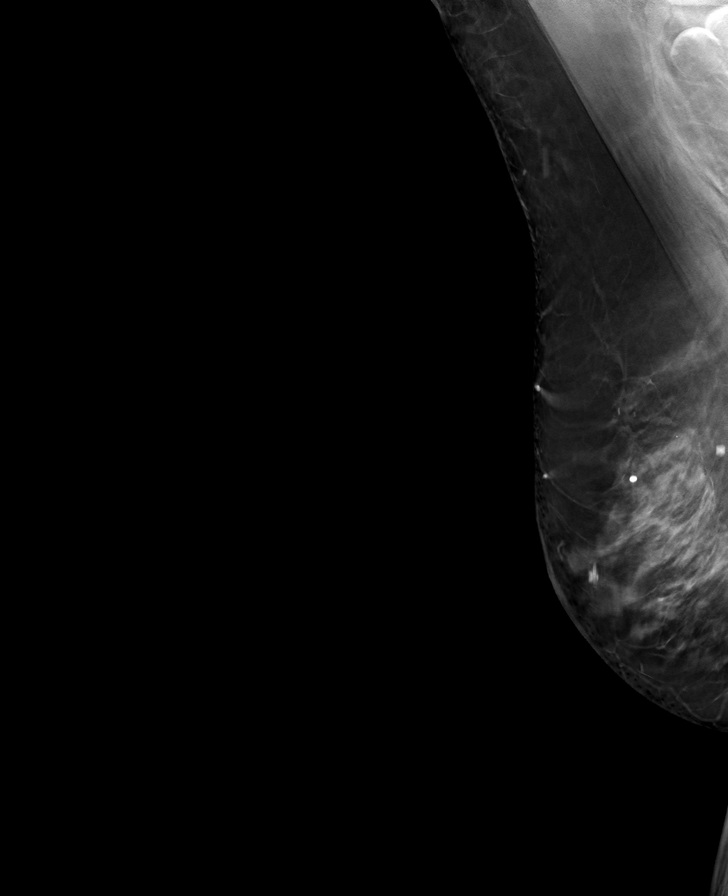

[L MLO tomo · tomo slice 43/86.0]
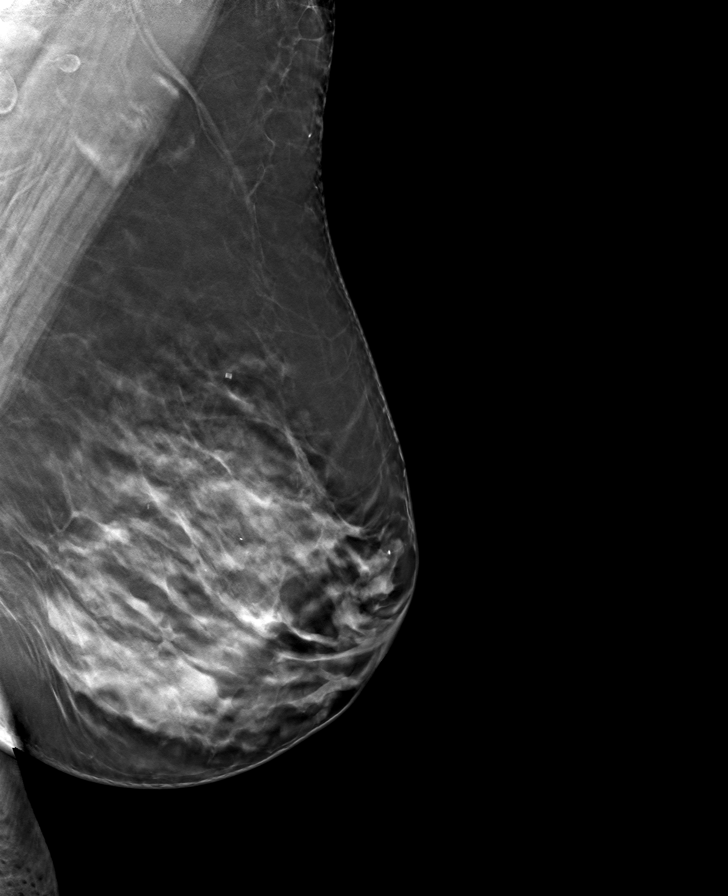

[8 of 24 positions shown; findings below may reference images not displayed]

ACR Breast Density Category c: The breast tissue is heterogeneously
dense, which may obscure small masses.
FINDINGS: There are no findings suspicious for malignancy. Images were
processed with CAD.
IMPRESSION: No mammographic evidence of malignancy. A result letter of this
screening mammogram will be mailed directly to the patient.

RECOMMENDATION:
Screening mammogram in one year. (Code:T3-9-XSR)

BI-RADS CATEGORY  1: Negative.

## 2020-12-04 ENCOUNTER — Other Ambulatory Visit: Payer: Self-pay | Admitting: Obstetrics and Gynecology

## 2020-12-04 DIAGNOSIS — Z1231 Encounter for screening mammogram for malignant neoplasm of breast: Secondary | ICD-10-CM

## 2021-02-10 ENCOUNTER — Other Ambulatory Visit: Payer: Self-pay

## 2021-02-10 ENCOUNTER — Ambulatory Visit
Admission: RE | Admit: 2021-02-10 | Discharge: 2021-02-10 | Disposition: A | Discharge status: Home / Self Care | Payer: Medicare Other | Source: Ambulatory Visit | Attending: Obstetrics and Gynecology | Admitting: Obstetrics and Gynecology

## 2021-02-10 DIAGNOSIS — Z1231 Encounter for screening mammogram for malignant neoplasm of breast: Secondary | ICD-10-CM

## 2021-02-12 ENCOUNTER — Other Ambulatory Visit: Payer: Self-pay | Admitting: Obstetrics and Gynecology

## 2021-02-12 DIAGNOSIS — R928 Other abnormal and inconclusive findings on diagnostic imaging of breast: Secondary | ICD-10-CM

## 2021-02-16 ENCOUNTER — Ambulatory Visit
Admission: RE | Admit: 2021-02-16 | Discharge: 2021-02-16 | Disposition: A | Discharge status: Home / Self Care | Payer: Medicare Other | Source: Ambulatory Visit | Attending: Obstetrics and Gynecology | Admitting: Obstetrics and Gynecology

## 2021-02-16 ENCOUNTER — Other Ambulatory Visit: Payer: Self-pay

## 2021-02-16 ENCOUNTER — Other Ambulatory Visit: Payer: Self-pay | Admitting: Obstetrics and Gynecology

## 2021-02-16 DIAGNOSIS — R921 Mammographic calcification found on diagnostic imaging of breast: Secondary | ICD-10-CM

## 2021-02-16 DIAGNOSIS — R928 Other abnormal and inconclusive findings on diagnostic imaging of breast: Secondary | ICD-10-CM

## 2021-02-19 ENCOUNTER — Ambulatory Visit
Admission: RE | Admit: 2021-02-19 | Discharge: 2021-02-19 | Disposition: A | Discharge status: Home / Self Care | Payer: Medicare Other | Source: Ambulatory Visit | Attending: Obstetrics and Gynecology | Admitting: Obstetrics and Gynecology

## 2021-02-19 ENCOUNTER — Other Ambulatory Visit: Payer: Self-pay | Admitting: Obstetrics and Gynecology

## 2021-02-19 ENCOUNTER — Other Ambulatory Visit: Payer: Self-pay

## 2021-02-19 DIAGNOSIS — R921 Mammographic calcification found on diagnostic imaging of breast: Secondary | ICD-10-CM

## 2021-03-09 ENCOUNTER — Ambulatory Visit: Payer: Self-pay | Admitting: General Surgery

## 2021-03-09 DIAGNOSIS — D0511 Intraductal carcinoma in situ of right breast: Secondary | ICD-10-CM

## 2021-03-10 ENCOUNTER — Telehealth: Payer: Self-pay | Admitting: Hematology and Oncology

## 2021-03-10 NOTE — Telephone Encounter (Signed)
Received a new pt referral from Dr. Marlou Starks for a new dx of breast cancer. Nicole Meyers has been cld and scheduled to see Dr. Chryl Heck on 7/13 at 10am followed by her appt w/dr. Sondra Come.

## 2021-03-12 NOTE — Progress Notes (Signed)
Location of Breast Cancer: right breast  Histology per Pathology Report:  02/19/2021   Receptor Status:    Did patient present with symptoms (if so, please note symptoms) or was this found on screening mammography?: screening mammography  Past/Anticipated interventions by surgeon, if any:     Past/Anticipated interventions by medical oncology, if any: Has appointment with Dr Chryl Heck 03/17/2021  Lymphedema issues, if any:  no    Pain issues, if any:  no   SAFETY ISSUES: Prior radiation? yes, right breast 2000 Pacemaker/ICD? no Possible current pregnancy? no, postmenopausal Is the patient on methotrexate? no  Current Complaints / other details:  none     Vitals:   03/17/21 0740  BP: (!) 142/80  Pulse: 71  Resp: 20  Temp: 97.6 F (36.4 C)  SpO2: 99%  Weight: 189 lb 6.4 oz (85.9 kg)  Height: 5\' 5"  (1.651 m)

## 2021-03-15 NOTE — Progress Notes (Signed)
Radiation Oncology         [phone] ________________________________  Initial Outpatient Consultation  Name: Nicole Meyers MRN: [id number]  Date: 03/17/2021  DOB: 1956/08/03  [id number], Mervyn Gay, MD  Jovita Kussmaul, MD   REFERRING PHYSICIAN: Autumn Messing III, MD  DIAGNOSIS: The encounter diagnosis was Ductal carcinoma in situ (DCIS) of right breast.  Right Breast UOQ, Intermediate Grade DCIS, ER+ / PR+    HISTORY OF PRESENT ILLNESS::Nicole Meyers is a 65 y.o. female who is accompanied by no one. she is seen as a courtesy of Dr. Marlou Starks for an opinion concerning radiation therapy as part of management for her recently diagnosed right breast cancer. The patient presented to [facility] on 02/10/21  for a routine screening mammogram which showed possible calcification in the right breast, warranting further evaluation.  Diagnostic mammogram of the right breast performed on 02/16/21 revealed a 1.7 x 1.4 x 1.8 cm cluster of calcifications in the upper outer quadrant of the right breast.  Biopsy performed on 02/19/21 revealed: an intermediate grade ductal carcinoma in situ with necrosis and calcifications of the right breast (upper outer quadrant). Prognostic indicators significant for: estrogen receptor, 90% positive with strong staining intensity, and progesterone receptor, 30% positive with moderate staining intensity.   The patient last followed up Dr. Marlou Starks on 03/09/21 in which plans were discussed of right mastectomy and sentinel node biopsy. The patient expressed agreement to this plan.  She denied any breast pain or discharge from the nipple at the time of this visit, and was otherwise noted to be of good health.     PREVIOUS RADIATION THERAPY: Yes: History of ductal carcinoma in situ of the right breast about 22 years ago that was treated with lumpectomy, radiation, and tamoxifen.  She received her radiation therapy under the direction of Dr. Arloa Koh.  Her surgeon at  that time was Fanny Skates.  She reports having approximately 6 and half weeks of radiation therapy.  She reports tolerating this treatment well without any significant after effects.  She reports having her radiation therapy at [facility] HISTORY:  Past Medical History:  Diagnosis Date   Breast cancer ([place]) 2000   Right Breast Cancer   Cancer ([place])    Breast   Hyperlipidemia    Hypertension    Personal history of radiation therapy 2000   Right Breast Cancer    PAST SURGICAL HISTORY: Past Surgical History:  Procedure Laterality Date   BREAST LUMPECTOMY Right 2000   BREAST SURGERY     2000-Lumpectomy   CHOLECYSTECTOMY     1996    FAMILY HISTORY:  Family History  Problem Relation Age of Onset   Hypertension Father    Cancer Father 33       prostate   Diabetes Mother    Hypertension Sister    Hypertension Brother    Hypertension Sister    Hypertension Sister    Hypertension Brother    Diabetes Maternal Grandmother    Breast cancer Neg Hx     SOCIAL HISTORY:  Social History   Tobacco Use   Smoking status: Never   Smokeless tobacco: Never  Substance Use Topics   Alcohol use: Yes    Alcohol/week: 1.0 standard drink    Types: 1 Standard drinks or equivalent per week   Drug use: No    ALLERGIES:  Allergies  Allergen Reactions   No Known Allergies     MEDICATIONS:  Current Outpatient  Medications  Medication Sig Dispense Refill   calcium carbonate (OS-CAL) 600 MG TABS tablet Take 600 mg by mouth daily with breakfast.     CHIA SEED PO Take by mouth daily.     Cholecalciferol (VITAMIN D3) 2000 UNITS TABS Take 1 tablet by mouth daily.     Multiple Vitamin (MULTIVITAMIN) tablet Take 1 tablet by mouth daily.     Omega-3 Fatty Acids (OMEGA 3 PO) Take 1 capsule by mouth daily.     potassium gluconate 595 MG TABS tablet Take 595 mg by mouth daily.     Thiamine HCl (VITAMIN B-1) 250 MG tablet Take 250 mg by mouth daily.     vitamin E 400 UNIT  capsule Take 400 Units by mouth daily.     No current facility-administered medications for this encounter.    REVIEW OF SYSTEMS:  A 10+ POINT REVIEW OF SYSTEMS WAS OBTAINED including neurology, dermatology, psychiatry, cardiac, respiratory, lymph, extremities, GI, GU, musculoskeletal, constitutional, reproductive, HEENT.  She denies any pain within the breast area nipple discharge or bleeding prior to most recent biopsy and diagnosis.  She denies any new bony pain headaches dizziness or blurred vision.   PHYSICAL EXAM:  height is 5\' 5"  (1.651 m) and weight is 189 lb 6.4 oz (85.9 kg). Her temperature is 97.6 F (36.4 C). Her blood pressure is 142/80 (abnormal) and her pulse is 71. Her respiration is 20 and oxygen saturation is 99%.   General: Alert and oriented, in no acute distress HEENT: Head is normocephalic. Extraocular movements are intact.  Neck: Neck is supple, no palpable cervical or supraclavicular lymphadenopathy. Heart: Regular in rate and rhythm with no murmurs, rubs, or gallops. Chest: Clear to auscultation bilaterally, with no rhonchi, wheezes, or rales. Abdomen: Soft, nontender, nondistended, with no rigidity or guarding. Extremities: No cyanosis or edema. Lymphatics: see Neck Exam Skin: No concerning lesions. Musculoskeletal: symmetric strength and muscle tone throughout. Neurologic: Cranial nerves II through XII are grossly intact. No obvious focalities. Speech is fluent. Coordination is intact. Psychiatric: Judgment and insight are intact. Affect is appropriate. Left breast examination shows no palpable mass nipple discharge or bleeding.  Examination of the right breast reveals this breast at the somewhat smaller than the left consistent with prior surgery and radiation effect.  Tattoos noted along the chest area from her previous radiation treatments.  A faint lateral scar is noted at approximately the 9 o'clock position of the right breast.  She has a small biopsy site in  approximately the 12 o'clock position of the breast.  ECOG = 0  0 - Asymptomatic (Fully active, able to carry on all predisease activities without restriction)  1 - Symptomatic but completely ambulatory (Restricted in physically strenuous activity but ambulatory and able to carry out work of a light or sedentary nature. For example, light housework, office work)  2 - Symptomatic, <50% in bed during the day (Ambulatory and capable of all self care but unable to carry out any work activities. Up and about more than 50% of waking hours)  3 - Symptomatic, >50% in bed, but not bedbound (Capable of only limited self-care, confined to bed or chair 50% or more of waking hours)  4 - Bedbound (Completely disabled. Cannot carry on any self-care. Totally confined to bed or chair)  5 - Death   Eustace Pen MM, Creech RH, Tormey DC, et al. [phone]). "Toxicity and response criteria of the [facility]". [place] Oncol. 5 (6): 649-55  LABORATORY DATA:  Lab Results  Component Value Date   HGB 14.4 07/21/2009   HCT 42.9 07/21/2009   MCV 88 07/21/2009   PLT 316 07/21/2009   Lab Results  Component Value Date   NA 138 12/02/2014   K 4.6 12/02/2014   CL 103 12/02/2014   CO2 29 12/02/2014   GLUCOSE 105 (H) 12/02/2014   CREATININE 0.87 12/02/2014   CALCIUM 10.1 12/02/2014      RADIOGRAPHY: MM Digital Diagnostic Unilat R  Result Date: 02/16/2021 CLINICAL DATA:  Patient returns after screening study for evaluation of RIGHT breast calcifications. History of RIGHT lumpectomy with radiation therapy in 2000. EXAM: DIGITAL DIAGNOSTIC UNILATERAL RIGHT MAMMOGRAM TECHNIQUE: Right digital diagnostic mammography was performed. Mammographic images were processed with CAD. COMPARISON:  02/10/2021 and earlier ACR Breast Density Category c: The breast tissue is heterogeneously dense, which may obscure small masses. FINDINGS: Magnified views are performed of calcifications in the UPPER-OUTER QUADRANT  of the RIGHT breast. In this region there is a group of scattered pleomorphic calcifications spanning 1.7 x 1.4 x 1.8 centimeters. There are overlying dermal calcifications in this same location. IMPRESSION: Indeterminate RIGHT breast calcifications warranting tissue diagnosis. RECOMMENDATION: Recommend stereotactic guided core biopsy of RIGHT breast. I have discussed the findings and recommendations with the patient. If applicable, a reminder letter will be sent to the patient regarding the next appointment. BI-RADS CATEGORY  4: Suspicious. Electronically Signed   By: Nolon Nations M.D.   On: 02/16/2021 14:42  MM CLIP PLACEMENT RIGHT  Result Date: 02/19/2021 CLINICAL DATA:  Status post stereotactic guided core biopsy of RIGHT breast calcifications. EXAM: DIAGNOSTIC RIGHT MAMMOGRAM POST STEREOTACTIC BIOPSY COMPARISON:  Previous exam(s). FINDINGS: Mammographic images were obtained following stereotactic guided biopsy of RIGHT breast calcifications in the anterior UPPER OUTER QUADRANT of the RIGHT breast and placement of an X shaped clip. The biopsy marking clip is in expected position at the site of biopsy. IMPRESSION: Appropriate positioning of the X shaped biopsy marking clip at the site of biopsy in the anterior UPPER OUTER QUADRANT RIGHT breast. Final Assessment: Post Procedure Mammograms for Marker Placement Electronically Signed   By: Nolon Nations M.D.   On: 02/19/2021 09:15  MM RT BREAST BX W LOC DEV 1ST LESION IMAGE BX SPEC STEREO GUIDE  Addendum Date: 02/23/2021   ADDENDUM REPORT: 02/23/2021 08:21 ADDENDUM: Pathology revealed INTERMEDIATE GRADE DUCTAL CARCINOMA IN SITU WITH NECROSIS AND CALCIFICATIONS of the RIGHT breast, upper outer quadrant, anterior, X clip. This was found to be concordant by Dr. Nolon Nations. Pathology results were discussed with the patient by telephone. The patient reported doing well after the biopsy with tenderness at the site. Post biopsy instructions and care were  reviewed and questions were answered. The patient was encouraged to call [facility] for any additional concerns. Per patient request, surgical consultation has been arranged with Dr. Autumn Messing at [facility] on March 09, 2021. Pathology results reported by Stacie Acres RN on 02/23/2021. Electronically Signed   By: Nolon Nations M.D.   On: 02/23/2021 08:21   Result Date: 02/23/2021 CLINICAL DATA:  Patient presents for stereotactic guided core biopsy of RIGHT breast calcifications. History of RIGHT lumpectomy in 2000. EXAM: RIGHT BREAST STEREOTACTIC CORE NEEDLE BIOPSY COMPARISON:  Previous exams. FINDINGS: The patient and I discussed the procedure of stereotactic-guided biopsy including benefits and alternatives. We discussed the high likelihood of a successful procedure. We discussed the risks of the procedure including infection, bleeding, tissue injury, clip migration, and inadequate  sampling. Informed written consent was given. The usual time out protocol was performed immediately prior to the procedure. Using sterile technique and 1% Lidocaine as local anesthetic, under stereotactic guidance, a 9 gauge vacuum assisted device was used to perform core needle biopsy of calcifications in the anterior UPPER OUTER QUADRANT of the RIGHT using a craniocaudal approach. Specimen radiograph was performed showing calcifications numerous tissue samples. Specimens with calcifications are identified for pathology. Lesion quadrant: UPPER-OUTER QUADRANT RIGHT breast At the conclusion of the procedure, X shaped tissue marker clip was deployed into the biopsy cavity. Follow-up 2-view mammogram was performed and dictated separately. IMPRESSION: Stereotactic-guided biopsy of RIGHT breast calcifications. No apparent complications. Electronically Signed: By: Nolon Nations M.D. On: 02/19/2021 08:58     IMPRESSION: Right Breast UOQ, Intermediate Grade DCIS, ER+ / PR+   We discussed the  general management of DCIS including lumpectomy and adjuvant radiation therapy followed by adjuvant hormonal therapy.  She does have a prior history of radiation therapy directed to the right breast and recommendations in this situation would be for her to proceed with mastectomy.  This would be considered the standard of care approach.  Another potential option would be for her to do a lumpectomy without radiation therapy and close follow-up as well as adjuvant hormonal therapy.  Patient is not that interested in keeping her breast and is most comfortable with proceeding with mastectomy and sentinel node procedure as planned.  Would not recommend radiation therapy in the postoperative setting in this situation.  In addition the patient is not interested in consideration for immediate reconstruction or wishing to see a plastic surgeon in the future.    PLAN: Mastectomy with sentinel node procedure.  Patient will likely proceed with adjuvant hormonal therapy.  She did receive tamoxifen with her initial breast cancer and tolerated this well.  She will meet with Dr. Chryl Heck later today for medical oncology consultation.   60 minutes of total time was spent for this patient encounter, including preparation, face-to-face counseling with the patient and coordination of care, physical exam, and documentation of the encounter and review of previous radiation treatments.   ------------------------------------------------  Blair Promise, PhD, MD  This document serves as a record of services personally performed by Gery Pray, MD. It was created on his behalf by Roney Mans, a trained medical scribe. The creation of this record is based on the scribe's personal observations and the provider's statements to them. This document has been checked and approved by the attending provider.

## 2021-03-17 ENCOUNTER — Ambulatory Visit
Admission: RE | Admit: 2021-03-17 | Discharge: 2021-03-17 | Disposition: A | Discharge status: Home / Self Care | Payer: Medicare Other | Source: Ambulatory Visit | Attending: Radiation Oncology | Admitting: Radiation Oncology

## 2021-03-17 ENCOUNTER — Other Ambulatory Visit: Payer: Self-pay

## 2021-03-17 ENCOUNTER — Inpatient Hospital Stay: Payer: Medicare Other | Attending: Hematology and Oncology | Admitting: Hematology and Oncology

## 2021-03-17 ENCOUNTER — Encounter: Payer: Self-pay | Admitting: Hematology and Oncology

## 2021-03-17 ENCOUNTER — Encounter: Payer: Self-pay | Admitting: Radiation Oncology

## 2021-03-17 VITALS — BP 142/80 | HR 71 | Temp 97.6°F | Resp 20 | Ht 65.0 in | Wt 189.4 lb

## 2021-03-17 DIAGNOSIS — I10 Essential (primary) hypertension: Secondary | ICD-10-CM | POA: Insufficient documentation

## 2021-03-17 DIAGNOSIS — D0511 Intraductal carcinoma in situ of right breast: Secondary | ICD-10-CM | POA: Insufficient documentation

## 2021-03-17 DIAGNOSIS — Z79810 Long term (current) use of selective estrogen receptor modulators (SERMs): Secondary | ICD-10-CM | POA: Diagnosis not present

## 2021-03-17 DIAGNOSIS — E785 Hyperlipidemia, unspecified: Secondary | ICD-10-CM | POA: Diagnosis not present

## 2021-03-17 DIAGNOSIS — Z803 Family history of malignant neoplasm of breast: Secondary | ICD-10-CM | POA: Insufficient documentation

## 2021-03-17 DIAGNOSIS — Z923 Personal history of irradiation: Secondary | ICD-10-CM | POA: Diagnosis not present

## 2021-03-17 DIAGNOSIS — Z170 Estrogen receptor positive status [ER+]: Secondary | ICD-10-CM | POA: Diagnosis not present

## 2021-03-17 DIAGNOSIS — Z79899 Other long term (current) drug therapy: Secondary | ICD-10-CM | POA: Insufficient documentation

## 2021-03-17 NOTE — Assessment & Plan Note (Addendum)
This is a very pleasant 65 year old female patient with past medical history significant for right breast DCIS status postlumpectomy, radiation and adjuvant tamoxifen for 5 years now presents with abnormal screening mammogram findings, biopsy confirmed intermediate grade DCIS, ER +90% strong staining, PR +30%, moderate staining and is now referred back to medical oncology for additional recommendations.  Given her prior history of right breast radiation, we have discussed about proceeding with mastectomy followed by adjuvant endocrine therapies.  We have discussed about both tamoxifen and aromatase inhibitors as possible choices.  Since she is older than 24, there is no superiority of aromatase inhibitors or tamoxifen.  We have discussed mechanism of action of both, discussed adverse effects with tamoxifen including but not limited to postmenopausal symptoms, increased risk of DVT/PE/endometrial cancer and a questionable increased risk of cardiovascular events.  The benefit with tamoxifen would be increase in bone density.  We have discussed mechanism of action of aromatase inhibitors, adverse effects including but not limited to postmenopausal symptoms, bone loss and questionable increased risk of cardiovascular events. Overall her benefits of taking tamoxifen will outweigh her risks.  She is agreeable to tamoxifen or Arimidex but would like to try the Arimidex with bone density monitoring. She was recommended to return to clinic after surgery for initiation of Arimidex.  Repeat bone density due in November 2022.

## 2021-03-17 NOTE — Addendum Note (Signed)
Encounter addended by: Ranell Patrick, RN on: 03/17/2021 8:12 AM  Actions taken: Visit diagnoses modified

## 2021-03-17 NOTE — Progress Notes (Signed)
Nicole Meyers  Patient Care Team: Jinny Sanders, MD as PCP - General  CHIEF COMPLAINTS/PURPOSE OF CONSULTATION:  DCIS right breast  ASSESSMENT & PLAN:  Ductal carcinoma in situ (DCIS) of right breast This is a very pleasant 65 year old female patient with past medical history significant for right breast DCIS status postlumpectomy, radiation and adjuvant tamoxifen for 5 years now presents with abnormal screening mammogram findings, biopsy confirmed intermediate grade DCIS, ER +90% strong staining, PR +30%, moderate staining and is now referred back to medical oncology for additional recommendations.  Given her prior history of right breast radiation, we have discussed about proceeding with mastectomy followed by adjuvant endocrine therapies.  We have discussed about both tamoxifen and aromatase inhibitors as possible choices.  Since she is older than 84, there is no superiority of aromatase inhibitors or tamoxifen.  We have discussed mechanism of action of both, discussed adverse effects with tamoxifen including but not limited to postmenopausal symptoms, increased risk of DVT/PE/endometrial cancer and a questionable increased risk of cardiovascular events.  The benefit with tamoxifen would be increase in bone density.  We have discussed mechanism of action of aromatase inhibitors, adverse effects including but not limited to postmenopausal symptoms, bone loss and questionable increased risk of cardiovascular events. Overall her benefits of taking tamoxifen will outweigh her risks.  She is agreeable to tamoxifen or Arimidex but would like to try the Arimidex with bone density monitoring. She was recommended to return to clinic after surgery for initiation of Arimidex.  Repeat bone density due in November 2022.  No orders of the defined types were placed in this encounter.    HISTORY OF PRESENTING ILLNESS:  Nicole Meyers 65 y.o. female is here because of  DCIS  This is a very lovely 65 year old female patient with past medical history significant for DCIS in the right breast about 22 years ago treated with lumpectomy, radiation and tamoxifen who was found to have some abnormal cluster of calcifications on her routine screening mammogram. Most recent mammogram in June 2022 which showed right breast calcifications which warranted further evaluation.  She then had diagnostic mammogram and stereotactic biopsy was recommended. Right breast needle core biopsy in the upper outer quadrant showed intermediate grade ductal carcinoma in situ with necrosis and calcifications, ER +90% strong staining, PR +30% moderate staining She is referred back to surgery, medical oncology and radiation oncology for recommendations. She is here for initial visit to see medical oncology.  She saw Dr. Sondra Come this morning from radiation oncology. She feels well otherwise.  She does not take any medications for hypertension and dyslipidemia.  No family history of breast cancer.    Rest of the pertinent 10 point ROS reviewed and negative.  REVIEW OF SYSTEMS:   Constitutional: Denies fevers, chills or abnormal night sweats Eyes: Denies blurriness of vision, double vision or watery eyes Ears, nose, mouth, throat, and face: Denies mucositis or sore throat Respiratory: Denies cough, dyspnea or wheezes Cardiovascular: Denies palpitation, chest discomfort or lower extremity swelling Gastrointestinal:  Denies nausea, heartburn or change in bowel habits Skin: Denies abnormal skin rashes Lymphatics: Denies new lymphadenopathy or easy bruising Neurological:Denies numbness, tingling or new weaknesses Behavioral/Psych: Mood is stable, no new changes  All other systems were reviewed with the patient and are negative.  MEDICAL HISTORY:  Past Medical History:  Diagnosis Date   Breast cancer ([place]) 2000   Right Breast Cancer   Cancer [facility])    Breast   Hyperlipidemia  Hypertension     Personal history of radiation therapy 2000   Right Breast Cancer    SURGICAL HISTORY: Past Surgical History:  Procedure Laterality Date   BREAST LUMPECTOMY Right 2000   BREAST SURGERY     2000-Lumpectomy   CHOLECYSTECTOMY     1996    SOCIAL HISTORY: Social History   Socioeconomic History   Marital status: Married    Spouse name: Not on file   Number of children: Not on file   Years of education: Not on file   Highest education level: Not on file  Occupational History   Occupation: school administration     Comment: [employer]  Tobacco Use   Smoking status: Never   Smokeless tobacco: Never  Substance and Sexual Activity   Alcohol use: Yes    Alcohol/week: 1.0 standard drink    Types: 1 Standard drinks or equivalent per week   Drug use: No   Sexual activity: Never    Partners: Male  Other Topics Concern   Not on file  Social History Narrative   Married   2 female kids grown   Social Determinants of Health   Financial Resource Strain: Not on file  Food Insecurity: Not on file  Transportation Needs: Not on file  Physical Activity: Not on file  Stress: Not on file  Social Connections: Not on file  Intimate Partner Violence: Not on file    FAMILY HISTORY: Family History  Problem Relation Age of Onset   Hypertension Father    Cancer Father 75       prostate   Diabetes Mother    Hypertension Sister    Hypertension Brother    Hypertension Sister    Hypertension Sister    Hypertension Brother    Diabetes Maternal Grandmother    Breast cancer Neg Hx     ALLERGIES:  is allergic to no known allergies.  MEDICATIONS:  Current Outpatient Medications  Medication Sig Dispense Refill   calcium carbonate (OS-CAL) 600 MG TABS tablet Take 600 mg by mouth daily with breakfast.     CHIA SEED PO Take by mouth daily.     Cholecalciferol (VITAMIN D3) 2000 UNITS TABS Take 1 tablet by mouth daily.     Multiple Vitamin (MULTIVITAMIN) tablet Take 1 tablet by  mouth daily.     Omega-3 Fatty Acids (OMEGA 3 PO) Take 1 capsule by mouth daily.     potassium gluconate 595 MG TABS tablet Take 595 mg by mouth daily.     Thiamine HCl (VITAMIN B-1) 250 MG tablet Take 250 mg by mouth daily.     vitamin E 400 UNIT capsule Take 400 Units by mouth daily.     No current facility-administered medications for this visit.     PHYSICAL EXAMINATION: ECOG PERFORMANCE STATUS: 0 - Asymptomatic  Vitals:   03/17/21 1012  BP: (!) 157/87  Pulse: 71  Resp: 17  Temp: 98.6 F (37 C)  SpO2: 100%   Filed Weights   03/17/21 1012  Weight: 189 lb 12.8 oz (86.1 kg)    GENERAL:alert, no distress and comfortable SKIN: skin color, texture, turgor are normal, no rashes or significant lesions EYES: normal, conjunctiva are pink and non-injected, sclera clear OROPHARYNX:no exudate, no erythema and lips, buccal mucosa, and tongue normal  NECK: supple, thyroid normal size, non-tender, without nodularity LYMPH:  no palpable lymphadenopathy in the cervical, axillary or inguinal LUNGS: clear to auscultation and percussion with normal breathing effort HEART: regular rate & rhythm  and no murmurs and no lower extremity edema ABDOMEN:abdomen soft, non-tender and normal bowel sounds Musculoskeletal:no cyanosis of digits and no clubbing  PSYCH: alert & oriented x 3 with fluent speech NEURO: no focal motor/sensory deficits Breasts: Right breast appears visibly smaller compared to left breast given her previous surgery.  Palpable changes in the right breast upper outer quadrant likely from prior biopsy.  There is also faint scar on the left breast which was previously biopsied. No nipple discharge.  No regional adenopathy.  LABORATORY DATA:  I have reviewed the data as listed Lab Results  Component Value Date   HGB 14.4 07/21/2009   HCT 42.9 07/21/2009   MCV 88 07/21/2009   PLT 316 07/21/2009     Chemistry      Component Value Date/Time   NA 138 12/02/2014 0935   K 4.6  12/02/2014 0935   CL 103 12/02/2014 0935   CO2 29 12/02/2014 0935   BUN 19 12/02/2014 0935   CREATININE 0.87 12/02/2014 0935      Component Value Date/Time   CALCIUM 10.1 12/02/2014 0935   ALKPHOS 76 12/02/2014 0935   AST 15 12/02/2014 0935   ALT 15 12/02/2014 0935   BILITOT 0.4 12/02/2014 0935       RADIOGRAPHIC STUDIES: I have personally reviewed the radiological images as listed and agreed with the findings in the report. MM Digital Diagnostic Unilat R  Result Date: 02/16/2021 CLINICAL DATA:  Patient returns after screening study for evaluation of RIGHT breast calcifications. History of RIGHT lumpectomy with radiation therapy in 2000. EXAM: DIGITAL DIAGNOSTIC UNILATERAL RIGHT MAMMOGRAM TECHNIQUE: Right digital diagnostic mammography was performed. Mammographic images were processed with CAD. COMPARISON:  02/10/2021 and earlier ACR Breast Density Category c: The breast tissue is heterogeneously dense, which may obscure small masses. FINDINGS: Magnified views are performed of calcifications in the UPPER-OUTER QUADRANT of the RIGHT breast. In this region there is a group of scattered pleomorphic calcifications spanning 1.7 x 1.4 x 1.8 centimeters. There are overlying dermal calcifications in this same location. IMPRESSION: Indeterminate RIGHT breast calcifications warranting tissue diagnosis. RECOMMENDATION: Recommend stereotactic guided core biopsy of RIGHT breast. I have discussed the findings and recommendations with the patient. If applicable, a reminder letter will be sent to the patient regarding the next appointment. BI-RADS CATEGORY  4: Suspicious. Electronically Signed   By: Nolon Nations M.D.   On: 02/16/2021 14:42  MM CLIP PLACEMENT RIGHT  Result Date: 02/19/2021 CLINICAL DATA:  Status post stereotactic guided core biopsy of RIGHT breast calcifications. EXAM: DIAGNOSTIC RIGHT MAMMOGRAM POST STEREOTACTIC BIOPSY COMPARISON:  Previous exam(s). FINDINGS: Mammographic images were  obtained following stereotactic guided biopsy of RIGHT breast calcifications in the anterior UPPER OUTER QUADRANT of the RIGHT breast and placement of an X shaped clip. The biopsy marking clip is in expected position at the site of biopsy. IMPRESSION: Appropriate positioning of the X shaped biopsy marking clip at the site of biopsy in the anterior UPPER OUTER QUADRANT RIGHT breast. Final Assessment: Post Procedure Mammograms for Marker Placement Electronically Signed   By: Nolon Nations M.D.   On: 02/19/2021 09:15  MM RT BREAST BX W LOC DEV 1ST LESION IMAGE BX SPEC STEREO GUIDE  Addendum Date: 02/23/2021   ADDENDUM REPORT: 02/23/2021 08:21 ADDENDUM: Pathology revealed INTERMEDIATE GRADE DUCTAL CARCINOMA IN SITU WITH NECROSIS AND CALCIFICATIONS of the RIGHT breast, upper outer quadrant, anterior, X clip. This was found to be concordant by Dr. Nolon Nations. Pathology results were discussed with the patient by telephone.  The patient reported doing well after the biopsy with tenderness at the site. Post biopsy instructions and care were reviewed and questions were answered. The patient was encouraged to call [facility] for any additional concerns. Per patient request, surgical consultation has been arranged with Dr. Autumn Messing at [facility] on March 09, 2021. Pathology results reported by Stacie Acres RN on 02/23/2021. Electronically Signed   By: Nolon Nations M.D.   On: 02/23/2021 08:21   Result Date: 02/23/2021 CLINICAL DATA:  Patient presents for stereotactic guided core biopsy of RIGHT breast calcifications. History of RIGHT lumpectomy in 2000. EXAM: RIGHT BREAST STEREOTACTIC CORE NEEDLE BIOPSY COMPARISON:  Previous exams. FINDINGS: The patient and I discussed the procedure of stereotactic-guided biopsy including benefits and alternatives. We discussed the high likelihood of a successful procedure. We discussed the risks of the procedure including infection,  bleeding, tissue injury, clip migration, and inadequate sampling. Informed written consent was given. The usual time out protocol was performed immediately prior to the procedure. Using sterile technique and 1% Lidocaine as local anesthetic, under stereotactic guidance, a 9 gauge vacuum assisted device was used to perform core needle biopsy of calcifications in the anterior UPPER OUTER QUADRANT of the RIGHT using a craniocaudal approach. Specimen radiograph was performed showing calcifications numerous tissue samples. Specimens with calcifications are identified for pathology. Lesion quadrant: UPPER-OUTER QUADRANT RIGHT breast At the conclusion of the procedure, X shaped tissue marker clip was deployed into the biopsy cavity. Follow-up 2-view mammogram was performed and dictated separately. IMPRESSION: Stereotactic-guided biopsy of RIGHT breast calcifications. No apparent complications. Electronically Signed: By: Nolon Nations M.D. On: 02/19/2021 08:58   All questions were answered. The patient knows to call the clinic with any problems, questions or concerns. I spent 60 minutes in the care of this patient including H and P, review of records, counseling and coordination of care. We discussed about anti estrogen therapy in detail today as mentioned in A and P    Benay Pike, MD 03/17/2021 3:17 PM

## 2021-03-17 NOTE — Progress Notes (Signed)
See MD note for nursing evaluation. °

## 2021-03-18 ENCOUNTER — Encounter: Payer: Self-pay | Admitting: *Deleted

## 2021-03-30 ENCOUNTER — Encounter (HOSPITAL_BASED_OUTPATIENT_CLINIC_OR_DEPARTMENT_OTHER): Payer: Self-pay | Admitting: General Surgery

## 2021-03-30 ENCOUNTER — Other Ambulatory Visit: Payer: Self-pay

## 2021-04-02 NOTE — Progress Notes (Signed)

## 2021-04-05 HISTORY — PX: MASTECTOMY: SHX3

## 2021-04-06 ENCOUNTER — Ambulatory Visit (HOSPITAL_BASED_OUTPATIENT_CLINIC_OR_DEPARTMENT_OTHER): Payer: Medicare Other | Admitting: Anesthesiology

## 2021-04-06 ENCOUNTER — Encounter (HOSPITAL_BASED_OUTPATIENT_CLINIC_OR_DEPARTMENT_OTHER)
Admission: RE | Disposition: A | Discharge status: Home / Self Care | Payer: Self-pay | Source: Home / Self Care | Attending: General Surgery

## 2021-04-06 ENCOUNTER — Other Ambulatory Visit: Payer: Self-pay

## 2021-04-06 ENCOUNTER — Encounter (HOSPITAL_COMMUNITY): Payer: Medicare Other

## 2021-04-06 ENCOUNTER — Ambulatory Visit (HOSPITAL_BASED_OUTPATIENT_CLINIC_OR_DEPARTMENT_OTHER)
Admission: RE | Admit: 2021-04-06 | Discharge: 2021-04-07 | Disposition: A | Discharge status: Home / Self Care | Payer: Medicare Other | Attending: General Surgery | Admitting: General Surgery

## 2021-04-06 ENCOUNTER — Encounter (HOSPITAL_COMMUNITY)
Admission: RE | Admit: 2021-04-06 | Discharge: 2021-04-06 | Disposition: A | Discharge status: Home / Self Care | Payer: Medicare Other | Source: Ambulatory Visit | Attending: General Surgery | Admitting: General Surgery

## 2021-04-06 ENCOUNTER — Encounter (HOSPITAL_BASED_OUTPATIENT_CLINIC_OR_DEPARTMENT_OTHER): Payer: Self-pay | Admitting: General Surgery

## 2021-04-06 DIAGNOSIS — N6011 Diffuse cystic mastopathy of right breast: Secondary | ICD-10-CM | POA: Diagnosis not present

## 2021-04-06 DIAGNOSIS — D0511 Intraductal carcinoma in situ of right breast: Secondary | ICD-10-CM

## 2021-04-06 DIAGNOSIS — I10 Essential (primary) hypertension: Secondary | ICD-10-CM | POA: Diagnosis not present

## 2021-04-06 DIAGNOSIS — Z170 Estrogen receptor positive status [ER+]: Secondary | ICD-10-CM | POA: Diagnosis not present

## 2021-04-06 DIAGNOSIS — C50911 Malignant neoplasm of unspecified site of right female breast: Secondary | ICD-10-CM | POA: Diagnosis present

## 2021-04-06 DIAGNOSIS — G8918 Other acute postprocedural pain: Secondary | ICD-10-CM | POA: Diagnosis not present

## 2021-04-06 DIAGNOSIS — Z923 Personal history of irradiation: Secondary | ICD-10-CM | POA: Diagnosis not present

## 2021-04-06 DIAGNOSIS — N6081 Other benign mammary dysplasias of right breast: Secondary | ICD-10-CM | POA: Diagnosis not present

## 2021-04-06 DIAGNOSIS — E7800 Pure hypercholesterolemia, unspecified: Secondary | ICD-10-CM | POA: Diagnosis not present

## 2021-04-06 DIAGNOSIS — N641 Fat necrosis of breast: Secondary | ICD-10-CM | POA: Diagnosis not present

## 2021-04-06 HISTORY — PX: MASTECTOMY W/ SENTINEL NODE BIOPSY: SHX2001

## 2021-04-06 SURGERY — MASTECTOMY WITH SENTINEL LYMPH NODE BIOPSY
Anesthesia: General | Site: Breast | Laterality: Right

## 2021-04-06 MED ORDER — HYDROMORPHONE HCL 1 MG/ML IJ SOLN: 0.2500 mg | INTRAMUSCULAR | Status: DC | PRN

## 2021-04-06 MED ORDER — DEXAMETHASONE SODIUM PHOSPHATE 10 MG/ML IJ SOLN
INTRAMUSCULAR | Status: DC | PRN
  Administered 2021-04-06: 5 mg via INTRAVENOUS

## 2021-04-06 MED ORDER — 0.9 % SODIUM CHLORIDE (POUR BTL) OPTIME
TOPICAL | Status: DC | PRN
  Administered 2021-04-06: 500 mL

## 2021-04-06 MED ORDER — OXYCODONE HCL 5 MG PO TABS
5.0000 mg | ORAL_TABLET | Freq: Once | ORAL | Status: DC | PRN
Start: 2021-04-06 — End: 2021-04-06

## 2021-04-06 MED ORDER — CHLORHEXIDINE GLUCONATE CLOTH 2 % EX PADS: 6.0000 | MEDICATED_PAD | Freq: Once | CUTANEOUS | Status: DC

## 2021-04-06 MED ORDER — LIDOCAINE HCL (PF) 2 % IJ SOLN
INTRAMUSCULAR | Status: AC
  Filled 2021-04-06: qty 5

## 2021-04-06 MED ORDER — PROMETHAZINE HCL 25 MG/ML IJ SOLN: 6.2500 mg | INTRAMUSCULAR | Status: DC | PRN

## 2021-04-06 MED ORDER — TECHNETIUM TC 99M TILMANOCEPT KIT
1.0000 | PACK | Freq: Once | INTRAVENOUS | Status: AC | PRN
  Administered 2021-04-06: 1 via INTRADERMAL

## 2021-04-06 MED ORDER — GABAPENTIN 300 MG PO CAPS
ORAL_CAPSULE | ORAL | Status: AC
  Filled 2021-04-06: qty 1

## 2021-04-06 MED ORDER — FENTANYL CITRATE (PF) 100 MCG/2ML IJ SOLN
INTRAMUSCULAR | Status: AC
  Filled 2021-04-06: qty 2

## 2021-04-06 MED ORDER — LACTATED RINGERS IV SOLN: INTRAVENOUS | Status: DC

## 2021-04-06 MED ORDER — OXYCODONE HCL 5 MG/5ML PO SOLN: 5.0000 mg | Freq: Once | ORAL | Status: DC | PRN

## 2021-04-06 MED ORDER — HEPARIN SODIUM (PORCINE) 5000 UNIT/ML IJ SOLN
5000.0000 [IU] | Freq: Three times a day (TID) | INTRAMUSCULAR | Status: DC
  Administered 2021-04-07: 5000 [IU] via SUBCUTANEOUS

## 2021-04-06 MED ORDER — ACETAMINOPHEN 500 MG PO TABS
1000.0000 mg | ORAL_TABLET | ORAL | Status: AC
  Administered 2021-04-06: 1000 mg via ORAL

## 2021-04-06 MED ORDER — METHOCARBAMOL 500 MG PO TABS
500.0000 mg | ORAL_TABLET | Freq: Four times a day (QID) | ORAL | Status: DC | PRN
  Administered 2021-04-06: 500 mg via ORAL
  Filled 2021-04-06: qty 1

## 2021-04-06 MED ORDER — ONDANSETRON HCL 4 MG/2ML IJ SOLN
INTRAMUSCULAR | Status: DC | PRN
  Administered 2021-04-06: 4 mg via INTRAVENOUS

## 2021-04-06 MED ORDER — PANTOPRAZOLE SODIUM 40 MG IV SOLR: 40.0000 mg | Freq: Every day | INTRAVENOUS | Status: DC

## 2021-04-06 MED ORDER — ONDANSETRON 4 MG PO TBDP: 4.0000 mg | ORAL_TABLET | Freq: Four times a day (QID) | ORAL | Status: DC | PRN

## 2021-04-06 MED ORDER — PROPOFOL 10 MG/ML IV BOLUS
INTRAVENOUS | Status: DC | PRN
  Administered 2021-04-06: 200 mg via INTRAVENOUS

## 2021-04-06 MED ORDER — SODIUM CHLORIDE 0.9 % IV SOLN: INTRAVENOUS | Status: DC

## 2021-04-06 MED ORDER — CEFAZOLIN SODIUM-DEXTROSE 2-4 GM/100ML-% IV SOLN
2.0000 g | INTRAVENOUS | Status: AC
  Administered 2021-04-06: 2 g via INTRAVENOUS

## 2021-04-06 MED ORDER — GABAPENTIN 300 MG PO CAPS: 300.0000 mg | ORAL_CAPSULE | ORAL | Status: DC

## 2021-04-06 MED ORDER — ROPIVACAINE HCL 5 MG/ML IJ SOLN
INTRAMUSCULAR | Status: DC | PRN
  Administered 2021-04-06: 30 mL via PERINEURAL

## 2021-04-06 MED ORDER — ACETAMINOPHEN 500 MG PO TABS
ORAL_TABLET | ORAL | Status: AC
  Filled 2021-04-06: qty 2

## 2021-04-06 MED ORDER — LIDOCAINE HCL (CARDIAC) PF 100 MG/5ML IV SOSY
PREFILLED_SYRINGE | INTRAVENOUS | Status: DC | PRN
  Administered 2021-04-06: 60 mg via INTRAVENOUS

## 2021-04-06 MED ORDER — CEFAZOLIN SODIUM-DEXTROSE 2-4 GM/100ML-% IV SOLN
INTRAVENOUS | Status: AC
  Filled 2021-04-06: qty 100

## 2021-04-06 MED ORDER — FENTANYL CITRATE (PF) 100 MCG/2ML IJ SOLN
100.0000 ug | Freq: Once | INTRAMUSCULAR | Status: AC
  Administered 2021-04-06: 100 ug via INTRAVENOUS

## 2021-04-06 MED ORDER — EPHEDRINE SULFATE 50 MG/ML IJ SOLN
INTRAMUSCULAR | Status: DC | PRN
  Administered 2021-04-06: 10 mg via INTRAVENOUS
  Administered 2021-04-06: 5 mg via INTRAVENOUS
  Administered 2021-04-06: 10 mg via INTRAVENOUS

## 2021-04-06 MED ORDER — MIDAZOLAM HCL 2 MG/2ML IJ SOLN
INTRAMUSCULAR | Status: AC
  Filled 2021-04-06: qty 2

## 2021-04-06 MED ORDER — MORPHINE SULFATE (PF) 4 MG/ML IV SOLN: 1.0000 mg | INTRAVENOUS | Status: DC | PRN

## 2021-04-06 MED ORDER — ONDANSETRON HCL 4 MG/2ML IJ SOLN: 4.0000 mg | Freq: Four times a day (QID) | INTRAMUSCULAR | Status: DC | PRN

## 2021-04-06 MED ORDER — EPHEDRINE 5 MG/ML INJ
INTRAVENOUS | Status: AC
  Filled 2021-04-06: qty 10

## 2021-04-06 MED ORDER — AMISULPRIDE (ANTIEMETIC) 5 MG/2ML IV SOLN: 10.0000 mg | Freq: Once | INTRAVENOUS | Status: DC | PRN

## 2021-04-06 MED ORDER — PROPOFOL 10 MG/ML IV BOLUS
INTRAVENOUS | Status: AC
  Filled 2021-04-06: qty 20

## 2021-04-06 MED ORDER — MEPERIDINE HCL 25 MG/ML IJ SOLN: 6.2500 mg | INTRAMUSCULAR | Status: DC | PRN

## 2021-04-06 MED ORDER — MIDAZOLAM HCL 2 MG/2ML IJ SOLN
2.0000 mg | Freq: Once | INTRAMUSCULAR | Status: AC
  Administered 2021-04-06: 2 mg via INTRAVENOUS

## 2021-04-06 MED ORDER — FENTANYL CITRATE (PF) 100 MCG/2ML IJ SOLN
INTRAMUSCULAR | Status: DC | PRN
  Administered 2021-04-06: 25 ug via INTRAVENOUS

## 2021-04-06 MED ORDER — HYDROCODONE-ACETAMINOPHEN 5-325 MG PO TABS
1.0000 | ORAL_TABLET | ORAL | Status: DC | PRN
  Administered 2021-04-06: 1 via ORAL
  Filled 2021-04-06: qty 1

## 2021-04-06 MED ORDER — CELECOXIB 200 MG PO CAPS
200.0000 mg | ORAL_CAPSULE | ORAL | Status: AC
  Administered 2021-04-06: 200 mg via ORAL

## 2021-04-06 MED ORDER — CELECOXIB 200 MG PO CAPS
ORAL_CAPSULE | ORAL | Status: AC
  Filled 2021-04-06: qty 1

## 2021-04-06 MED ORDER — ONDANSETRON HCL 4 MG/2ML IJ SOLN
INTRAMUSCULAR | Status: AC
  Filled 2021-04-06: qty 2

## 2021-04-06 MED ORDER — DEXAMETHASONE SODIUM PHOSPHATE 10 MG/ML IJ SOLN
INTRAMUSCULAR | Status: AC
  Filled 2021-04-06: qty 1

## 2021-04-06 SURGICAL SUPPLY — 58 items
ADH SKN CLS APL DERMABOND .7 (GAUZE/BANDAGES/DRESSINGS) ×1
APL PRP STRL LF DISP 70% ISPRP (MISCELLANEOUS) ×1
APPLIER CLIP 9.375 MED OPEN (MISCELLANEOUS) ×2
APR CLP MED 9.3 20 MLT OPN (MISCELLANEOUS) ×1
BINDER BREAST LRG (GAUZE/BANDAGES/DRESSINGS) IMPLANT
BINDER BREAST XLRG (GAUZE/BANDAGES/DRESSINGS) ×1 IMPLANT
BINDER BREAST XXLRG (GAUZE/BANDAGES/DRESSINGS) IMPLANT
BIOPATCH RED 1 DISK 7.0 (GAUZE/BANDAGES/DRESSINGS) ×2 IMPLANT
BLADE SURG 10 STRL SS (BLADE) ×2 IMPLANT
BLADE SURG 15 STRL LF DISP TIS (BLADE) ×1 IMPLANT
BLADE SURG 15 STRL SS (BLADE) ×2
CANISTER SUCT 1200ML W/VALVE (MISCELLANEOUS) ×2 IMPLANT
CHLORAPREP W/TINT 26 (MISCELLANEOUS) ×2 IMPLANT
CLIP APPLIE 9.375 MED OPEN (MISCELLANEOUS) ×1 IMPLANT
COVER BACK TABLE 60X90IN (DRAPES) ×2 IMPLANT
COVER MAYO STAND STRL (DRAPES) ×2 IMPLANT
COVER PROBE W GEL 5X96 (DRAPES) ×1 IMPLANT
DECANTER SPIKE VIAL GLASS SM (MISCELLANEOUS) IMPLANT
DERMABOND ADVANCED (GAUZE/BANDAGES/DRESSINGS) ×1
DERMABOND ADVANCED .7 DNX12 (GAUZE/BANDAGES/DRESSINGS) ×1 IMPLANT
DEVICE DSSCT PLSMBLD 3.0S LGHT (MISCELLANEOUS) ×1 IMPLANT
DRAIN CHANNEL 19F RND (DRAIN) ×2 IMPLANT
DRAPE LAPAROSCOPIC ABDOMINAL (DRAPES) ×2 IMPLANT
DRAPE UTILITY XL STRL (DRAPES) ×2 IMPLANT
DRSG PAD ABDOMINAL 8X10 ST (GAUZE/BANDAGES/DRESSINGS) ×2 IMPLANT
DRSG TEGADERM 2-3/8X2-3/4 SM (GAUZE/BANDAGES/DRESSINGS) ×2 IMPLANT
ELECT COATED BLADE 2.86 ST (ELECTRODE) IMPLANT
ELECT REM PT RETURN 9FT ADLT (ELECTROSURGICAL) ×2
ELECTRODE REM PT RTRN 9FT ADLT (ELECTROSURGICAL) ×1 IMPLANT
EVACUATOR SILICONE 100CC (DRAIN) ×2 IMPLANT
GAUZE SPONGE 4X4 12PLY STRL LF (GAUZE/BANDAGES/DRESSINGS) ×3 IMPLANT
GLOVE SURG ENC MOIS LTX SZ6 (GLOVE) ×1 IMPLANT
GLOVE SURG ENC MOIS LTX SZ7.5 (GLOVE) ×3 IMPLANT
GLOVE SURG POLYISO LF SZ7 (GLOVE) ×1 IMPLANT
GLOVE SURG UNDER POLY LF SZ6.5 (GLOVE) ×1 IMPLANT
GLOVE SURG UNDER POLY LF SZ7 (GLOVE) ×4 IMPLANT
GOWN STRL REUS W/ TWL LRG LVL3 (GOWN DISPOSABLE) ×2 IMPLANT
GOWN STRL REUS W/TWL LRG LVL3 (GOWN DISPOSABLE) ×8
ILLUMINATOR WAVEGUIDE N/F (MISCELLANEOUS) IMPLANT
LIGHT WAVEGUIDE WIDE FLAT (MISCELLANEOUS) IMPLANT
NDL HYPO 25X1 1.5 SAFETY (NEEDLE) ×1 IMPLANT
NEEDLE HYPO 25X1 1.5 SAFETY (NEEDLE) ×2 IMPLANT
NS IRRIG 1000ML POUR BTL (IV SOLUTION) ×2 IMPLANT
PACK BASIN DAY SURGERY FS (CUSTOM PROCEDURE TRAY) ×2 IMPLANT
PENCIL SMOKE EVACUATOR (MISCELLANEOUS) IMPLANT
PIN SAFETY STERILE (MISCELLANEOUS) ×2 IMPLANT
PLASMABLADE 3.0S W/LIGHT (MISCELLANEOUS) ×2
SLEEVE SCD COMPRESS KNEE MED (STOCKING) ×2 IMPLANT
SPONGE T-LAP 18X18 ~~LOC~~+RFID (SPONGE) ×3 IMPLANT
SUT ETHILON 2 0 FS 18 (SUTURE) ×2 IMPLANT
SUT MNCRL AB 4-0 PS2 18 (SUTURE) ×3 IMPLANT
SUT SILK 2 0 SH (SUTURE) IMPLANT
SUT VICRYL 3-0 CR8 SH (SUTURE) ×2 IMPLANT
SYR CONTROL 10ML LL (SYRINGE) ×2 IMPLANT
TOWEL GREEN STERILE FF (TOWEL DISPOSABLE) ×2 IMPLANT
TRAY FOLEY W/BAG SLVR 14FR LF (SET/KITS/TRAYS/PACK) IMPLANT
TUBE CONNECTING 20X1/4 (TUBING) ×2 IMPLANT
YANKAUER SUCT BULB TIP NO VENT (SUCTIONS) ×2 IMPLANT

## 2021-04-06 NOTE — H&P (Signed)
REFERRING PHYSICIAN:  Sherlyn Hay, *   PROVIDER:  Landry Corporal, MD   MRN: [id number] DOB: 13-Oct-1955    Subjective    Chief Complaint: Breast Cancer       History of Present Illness: Nicole Meyers is a 65 y.o. female who is seen today as an office consultation at the request of Dr. Terri Piedra for evaluation of Breast Cancer .     We are asked to see the patient in consultation by Dr. Terri Piedra to evaluate her for a new right breast cancer.  The patient is a 65 year old white female who recently went for routine screening mammogram.  At that time she was found to have a 1.8 cm cluster of calcifications in the upper outer quadrant of the right breast that was new.  This was biopsied and came back as ductal carcinoma in situ that was ER and PR positive.  She does have a history of ductal carcinoma in situ of the right breast about 22 years ago that was treated with lumpectomy, radiation, and tamoxifen.  She denies any breast pain or discharge from the nipple.  She is otherwise in good health and does not smoke.     Review of Systems: A complete review of systems was obtained from the patient.  I have reviewed this information and discussed as appropriate with the patient.  See HPI as well for other ROS.   ROS      Medical History: Past Medical History      Past Medical History:  Diagnosis Date   History of cancer             Patient Active Problem List  Diagnosis   Ductal carcinoma in situ (DCIS) of right breast      Past Surgical History  History reviewed. No pertinent surgical history.      Allergies  No Known Allergies     No current outpatient medications on file prior to visit.    No current facility-administered medications on file prior to visit.      Family History  History reviewed. No pertinent family history.      Social History       Tobacco Use  Smoking Status Never Smoker  Smokeless Tobacco Never Used      Social History  Social  History        Socioeconomic History   Marital status: Married  Tobacco Use   Smoking status: Never Smoker   Smokeless tobacco: Never Used  Substance and Sexual Activity   Alcohol use: Never        Objective:         Vitals:     BP: (!) 140/88  Pulse: 93  Resp: 18  Temp: 36.2 C (97.1 F)  SpO2: 99%  Weight: 84.8 kg (187 lb)  Height: 165.1 cm ('5\' 5"'$ )    Body mass index is 31.12 kg/m.   Physical Exam Constitutional:      General: She is not in acute distress.    Appearance: Normal appearance.  HENT:     Head: Normocephalic and atraumatic.     Right Ear: External ear normal.     Left Ear: External ear normal.     Nose: Nose normal.     Mouth/Throat:     Mouth: Mucous membranes are moist.     Pharynx: Oropharynx is clear.  Eyes:     General: No scleral icterus.    Extraocular Movements: Extraocular movements intact.     Conjunctiva/sclera:  Conjunctivae normal.     Pupils: Pupils are equal, round, and reactive to light.  Cardiovascular:     Rate and Rhythm: Normal rate and regular rhythm.     Pulses: Normal pulses.     Heart sounds: Murmur heard.  Pulmonary:     Effort: Pulmonary effort is normal. No respiratory distress.     Breath sounds: Normal breath sounds.  Abdominal:     General: Abdomen is flat. There is no distension.     Palpations: Abdomen is soft. There is no mass.     Tenderness: There is no abdominal tenderness.  Musculoskeletal:        General: No swelling or tenderness. Normal range of motion.     Cervical back: Normal range of motion and neck supple. No tenderness.  Skin:    General: Skin is warm and dry.     Coloration: Skin is not jaundiced.  Neurological:     General: No focal deficit present.     Mental Status: She is alert and oriented to person, place, and time.  Psychiatric:        Mood and Affect: Mood normal.        Behavior: Behavior normal.        Thought Content: Thought content normal.      Breast: There is no palpable  mass in either breast.  There is no palpable axillary, supraclavicular, or cervical lymphadenopathy.       Labs, Imaging and Diagnostic Testing:     Assessment and Plan:  Diagnoses and all orders for this visit:   Ductal carcinoma in situ (DCIS) of right breast -     Ambulatory Referral to Oncology-Medical -     Ambulatory Referral to Radiation Oncology -     CCS Case Posting Request; Future       The patient appears to have a small area of ductal carcinoma in situ in the upper outer quadrant of the right breast.  We have discussed the options for treatment in detail today.  Because of her history of radiation therapy my recommendation would be for right mastectomy and sentinel node biopsy.  She is in agreement with this.  I have discussed with her in detail the risks and benefits of the operation as well as some of the technical aspects and she understands and wishes to proceed.  I have offered her the option for reconstruction and she is not interested in this at this time.

## 2021-04-06 NOTE — Progress Notes (Signed)
Assisted Dr. Miller with right, ultrasound guided, pectoralis block. Side rails up, monitors on throughout procedure. See vital signs in flow sheet. Tolerated Procedure well. 

## 2021-04-06 NOTE — Transfer of Care (Signed)
Immediate Anesthesia Transfer of Care Note  Patient: Nicole Meyers  Procedure(s) Performed: RIGHT MASTECTOMY WITH SENTINEL LYMPH NODE BIOPSY (Right: Breast)  Patient Location: PACU  Anesthesia Type:GA combined with regional for post-op pain  Level of Consciousness: drowsy  Airway & Oxygen Therapy: Patient Spontanous Breathing and Patient connected to face mask oxygen  Post-op Assessment: Report given to RN and Post -op Vital signs reviewed and stable  Post vital signs: Reviewed and stable  Last Vitals:  Vitals Value Taken Time  BP 124/76 04/06/21 1154  Temp    Pulse 82 04/06/21 1156  Resp 17 04/06/21 1156  SpO2 95 % 04/06/21 1156  Vitals shown include unvalidated device data.  Last Pain:  Vitals:   04/06/21 0836  TempSrc: Oral  PainSc: 0-No pain         Complications: No notable events documented.

## 2021-04-06 NOTE — Anesthesia Procedure Notes (Signed)
Procedure Name: LMA Insertion Date/Time: 04/06/2021 10:17 AM Performed by: Lavonia Dana, CRNA Pre-anesthesia Checklist: Patient identified, Emergency Drugs available, Suction available and Patient being monitored Patient Re-evaluated:Patient Re-evaluated prior to induction Oxygen Delivery Method: Circle system utilized Preoxygenation: Pre-oxygenation with 100% oxygen Induction Type: IV induction Ventilation: Mask ventilation without difficulty LMA: LMA inserted LMA Size: 4.0 Number of attempts: 1 Airway Equipment and Method: Bite block Placement Confirmation: positive ETCO2 Tube secured with: Tape Dental Injury: Teeth and Oropharynx as per pre-operative assessment

## 2021-04-06 NOTE — Interval H&P Note (Signed)
History and Physical Interval Note:  04/06/2021 10:03 AM  Nicole Meyers  has presented today for surgery, with the diagnosis of RIGHT BREAST DCIS.  The various methods of treatment have been discussed with the patient and family. After consideration of risks, benefits and other options for treatment, the patient has consented to  Procedure(s): RIGHT MASTECTOMY WITH SENTINEL LYMPH NODE BIOPSY (Right) as a surgical intervention.  The patient's history has been reviewed, patient examined, no change in status, stable for surgery.  I have reviewed the patient's chart and labs.  Questions were answered to the patient's satisfaction.     Autumn Messing III

## 2021-04-06 NOTE — Anesthesia Procedure Notes (Signed)
Anesthesia Regional Block: Pectoralis block   Pre-Anesthetic Checklist: , timeout performed,  Correct Patient, Correct Site, Correct Laterality,  Correct Procedure, Correct Position, site marked,  Risks and benefits discussed,  Surgical consent,  Pre-op evaluation,  At surgeon's request and post-op pain management  Laterality: Right  Prep: chloraprep       Needles:  Injection technique: Single-shot  Needle Type: Stimiplex     Needle Length: 9cm  Needle Gauge: 21     Additional Needles:   Procedures:,,,, ultrasound used (permanent image in chart),,    Narrative:  Start time: 04/06/2021 9:42 AM End time: 04/06/2021 9:47 AM Injection made incrementally with aspirations every 5 mL.  Performed by: Personally  Anesthesiologist: Lynda Rainwater, MD

## 2021-04-06 NOTE — Anesthesia Preprocedure Evaluation (Signed)
Anesthesia Evaluation  Patient identified by MRN, date of birth, ID band Patient awake    Reviewed: Allergy & Precautions, NPO status , Patient's Chart, lab work & pertinent test results  Airway Mallampati: II  TM Distance: >3 FB Neck ROM: Full    Dental no notable dental hx.    Pulmonary neg pulmonary ROS,    Pulmonary exam normal breath sounds clear to auscultation       Cardiovascular hypertension, Pt. on medications negative cardio ROS Normal cardiovascular exam Rhythm:Regular Rate:Normal     Neuro/Psych negative neurological ROS  negative psych ROS   GI/Hepatic negative GI ROS, Neg liver ROS,   Endo/Other  negative endocrine ROS  Renal/GU negative Renal ROS  negative genitourinary   Musculoskeletal negative musculoskeletal ROS (+)   Abdominal (+) + obese,   Peds negative pediatric ROS (+)  Hematology negative hematology ROS (+)   Anesthesia Other Findings Breast Cancer  Reproductive/Obstetrics negative OB ROS                             Anesthesia Physical Anesthesia Plan  ASA: 3  Anesthesia Plan: General   Post-op Pain Management:  Regional for Post-op pain   Induction: Intravenous  PONV Risk Score and Plan: 3 and Ondansetron, Dexamethasone, Midazolam and Treatment may vary due to age or medical condition  Airway Management Planned: LMA  Additional Equipment:   Intra-op Plan:   Post-operative Plan: Extubation in OR  Informed Consent: I have reviewed the patients History and Physical, chart, labs and discussed the procedure including the risks, benefits and alternatives for the proposed anesthesia with the patient or authorized representative who has indicated his/her understanding and acceptance.     Dental advisory given  Plan Discussed with: CRNA  Anesthesia Plan Comments:         Anesthesia Quick Evaluation

## 2021-04-06 NOTE — Progress Notes (Signed)
Emotional support provided through nuclear medicine breast injections. Vital signs stable. Patient tolerated well.  

## 2021-04-06 NOTE — Op Note (Signed)
04/06/2021  11:50 AM  PATIENT:  Nicole Meyers  65 y.o. female  PRE-OPERATIVE DIAGNOSIS:  RIGHT BREAST DCIS  POST-OPERATIVE DIAGNOSIS:  RIGHT BREAST DCIS  PROCEDURE:  Procedure(s): RIGHT MASTECTOMY WITH DEEP RIGHT AXILLARY SENTINEL LYMPH NODE BIOPSY (Right)  SURGEON:  Surgeon(s) and Role:    * Jovita Kussmaul, MD - Primary  PHYSICIAN ASSISTANT:   ASSISTANTS: Dr. Thalia Bloodgood   ANESTHESIA:   general  EBL:  25 mL   BLOOD ADMINISTERED:none  DRAINS: (1) Blake drain(s) in the prepectoral space    LOCAL MEDICATIONS USED:  NONE  SPECIMEN:  Source of Specimen:  right mastectomy and sentinel node x 2  DISPOSITION OF SPECIMEN:  PATHOLOGY  COUNTS:  YES  TOURNIQUET:  * No tourniquets in log *  DICTATION: .Dragon Dictation  After informed consent was obtained the patient was brought to the operating room and placed in the supine position on the operating table.  After adequate induction of general anesthesia the patient's right chest, breast, and axillary area were prepped with ChloraPrep, allowed to dry, and draped in usual sterile manner.  An appropriate timeout was performed.  Earlier in the day the patient underwent injection of 1 mCi of technetium sulfur colloid in the subareolar position on the right.  The neoprobe was set to technetium and there was a good signal in the right axilla.  An elliptical incision was then made around the nipple and areolar complex with a 10 blade knife in order to minimize the excess skin.  The incision was carried through the skin and subcutaneous tissue sharply with the PlasmaBlade.  Breast hooks were used to elevate the skin flaps anteriorly towards the ceiling.  Thin skin flaps were then created by dissecting between the breast tissue and the subcutaneous fat and skin.  This dissection was carried all the way to the chest wall circumferentially.  Next the breast was removed from the pectoralis muscle with the pectoralis fascia.  Once this was accomplished the  entire right breast was removed from the patient without difficulty.  The breast was marked with a stitch on the lateral skin and sent to pathology for further evaluation.  The neoprobe was then used to direct sharp dissection with the PlasmaBlade into the deep right axillary space.  I was able to identify 1 hot lymph node and 1 palpable lymph node.  Both were excised sharply with the PlasmaBlade and the surrounding small vessels and lymphatics were controlled with clips.  Ex vivo count on the first sentinel node was approximately 200.  No other hot or palpable nodes were identified in the right axilla.  Hemostasis was achieved using the PlasmaBlade.  The wound was irrigated with copious amounts of saline.  A small stab incision was made near the anterior axillary line inferior to the operative bed.  A tonsil clamp was placed through this opening and used to bring a 19 [place] round Blake drain into the operative bed.  The drain was curled along the chest wall.  The drain was anchored to the skin with a 3-0 nylon stitch.  Next the superior and inferior skin flaps were grossly reapproximated with interrupted 3-0 Vicryl stitches.  The skin was then closed with running 4-0 Monocryl subcuticular stitches.  Dermabond dressings were applied.  The patient tolerated the procedure well.  The drains were placed to bulb suction and there was a good seal.  The skin flaps appeared healthy.  At the end of the case all needle sponge and instrument counts  were correct.  The patient was then awakened and taken to recovery in stable condition. I was personally present during the key and critical portions of this procedure and immediately available throughout the entire procedure, as documented in my operative note.   PLAN OF CARE: Admit for overnight observation  PATIENT DISPOSITION:  PACU - hemodynamically stable.   Delay start of Pharmacological VTE agent (>24hrs) due to surgical blood loss or risk of bleeding: not  applicable

## 2021-04-06 NOTE — Anesthesia Postprocedure Evaluation (Signed)
Anesthesia Post Note  Patient: Nicole Meyers  Procedure(s) Performed: RIGHT MASTECTOMY WITH SENTINEL LYMPH NODE BIOPSY (Right: Breast)     Patient location during evaluation: PACU Anesthesia Type: General Level of consciousness: awake and alert Pain management: pain level controlled Vital Signs Assessment: post-procedure vital signs reviewed and stable Respiratory status: spontaneous breathing, nonlabored ventilation and respiratory function stable Cardiovascular status: blood pressure returned to baseline and stable Postop Assessment: no apparent nausea or vomiting Anesthetic complications: no   No notable events documented.  Last Vitals:  Vitals:   04/06/21 1230 04/06/21 1246  BP: 135/84 137/76  Pulse: 84 79  Resp: 12 16  Temp:  36.7 C  SpO2: 97% 97%    Last Pain:  Vitals:   04/06/21 1246  TempSrc:   PainSc: 0-No pain                 Lynda Rainwater

## 2021-04-07 ENCOUNTER — Encounter (HOSPITAL_BASED_OUTPATIENT_CLINIC_OR_DEPARTMENT_OTHER): Payer: Self-pay | Admitting: General Surgery

## 2021-04-07 DIAGNOSIS — Z923 Personal history of irradiation: Secondary | ICD-10-CM | POA: Diagnosis not present

## 2021-04-07 DIAGNOSIS — N6011 Diffuse cystic mastopathy of right breast: Secondary | ICD-10-CM | POA: Diagnosis not present

## 2021-04-07 DIAGNOSIS — Z170 Estrogen receptor positive status [ER+]: Secondary | ICD-10-CM | POA: Diagnosis not present

## 2021-04-07 DIAGNOSIS — D0511 Intraductal carcinoma in situ of right breast: Secondary | ICD-10-CM | POA: Diagnosis not present

## 2021-04-07 MED ORDER — HYDROCODONE-ACETAMINOPHEN 5-325 MG PO TABS: 1.0000 | ORAL_TABLET | Freq: Four times a day (QID) | ORAL | 0 refills | Status: DC | PRN

## 2021-04-07 MED ORDER — METHOCARBAMOL 500 MG PO TABS: 500.0000 mg | ORAL_TABLET | Freq: Four times a day (QID) | ORAL | 2 refills | Status: DC | PRN

## 2021-04-07 MED ORDER — HEPARIN SODIUM (PORCINE) 5000 UNIT/ML IJ SOLN
INTRAMUSCULAR | Status: AC
  Filled 2021-04-07: qty 1

## 2021-04-07 NOTE — Discharge Instructions (Signed)

## 2021-04-07 NOTE — Progress Notes (Signed)
1 Day Post-Op   Subjective/Chief Complaint: No complaints   Objective: Vital signs in last 24 hours: Temp:  [97.9 F (36.6 C)-98.7 F (37.1 C)] 98.1 F (36.7 C) (08/03 0600) Pulse Rate:  [58-88] 60 (08/03 0700) Resp:  [12-25] 16 (08/03 0600) BP: (122-143)/(63-84) 141/63 (08/03 0600) SpO2:  [92 %-100 %] 95 % (08/03 0700) Weight:  [84.7 kg] 84.7 kg (08/02 0836)    Intake/Output from previous day: 08/02 0701 - 08/03 0700 In: 1854.3 [P.O.:382; I.V.:1472.3] Out: 1272.5 [Urine:1200; Drains:47.5; Blood:25] Intake/Output this shift: No intake/output data recorded.  General appearance: alert and cooperative Resp: clear to auscultation bilaterally Chest wall: skin flaps look good Cardio: regular rate and rhythm GI: soft, non-tender; bowel sounds normal; no masses,  no organomegaly  Lab Results:  No results for input(s): WBC, HGB, HCT, PLT in the last 72 hours. BMET No results for input(s): NA, K, CL, CO2, GLUCOSE, BUN, CREATININE, CALCIUM in the last 72 hours. PT/INR No results for input(s): LABPROT, INR in the last 72 hours. ABG No results for input(s): PHART, HCO3 in the last 72 hours.  Invalid input(s): PCO2, PO2  Studies/Results: NM Sentinel Node Inj-No Rpt (Breast)  Result Date: 04/06/2021 Sulfur Colloid was injected by the Nuclear Medicine Technologist for sentinel lymph node localization.    Anti-infectives: Anti-infectives (From admission, onward)    Start     Dose/Rate Route Frequency Ordered Stop   04/06/21 0815  ceFAZolin (ANCEF) IVPB 2g/100 mL premix        2 g 200 mL/hr over 30 Minutes Intravenous On call to O.R. 04/06/21 0813 04/06/21 1018       Assessment/Plan: s/p Procedure(s): RIGHT MASTECTOMY WITH SENTINEL LYMPH NODE BIOPSY (Right) Advance diet Discharge  LOS: 0 days    Nicole Meyers 04/07/2021

## 2021-04-08 LAB — SURGICAL PATHOLOGY

## 2021-04-12 ENCOUNTER — Encounter: Payer: Self-pay | Admitting: *Deleted

## 2021-04-29 ENCOUNTER — Other Ambulatory Visit: Payer: Self-pay

## 2021-04-29 ENCOUNTER — Ambulatory Visit: Payer: Medicare Other | Attending: General Surgery | Admitting: Physical Therapy

## 2021-04-29 ENCOUNTER — Encounter: Payer: Self-pay | Admitting: Physical Therapy

## 2021-04-29 DIAGNOSIS — D0511 Intraductal carcinoma in situ of right breast: Secondary | ICD-10-CM | POA: Diagnosis not present

## 2021-04-29 DIAGNOSIS — R293 Abnormal posture: Secondary | ICD-10-CM | POA: Insufficient documentation

## 2021-04-29 NOTE — Therapy (Signed)
[place], [place], [address] Phone: [phone]   Fax:  [phone]  Physical Therapy Evaluation  Patient Details  Name: Nicole Meyers MRN: [id number] Date of Birth: 07-Apr-1956 Referring Provider (PT): Joaquin Courts Date: 04/29/2021   PT End of Session - 04/29/21 1339     Visit Number 1    Number of Visits 1    PT Start Time 1304    PT Stop Time 1330    PT Time Calculation (min) 26 min    Activity Tolerance Patient tolerated treatment well    Behavior During Therapy [facility] for tasks assessed/performed             Past Medical History:  Diagnosis Date   Breast cancer ([place]) 2000   Right Breast Cancer   Cancer ([place])    Breast   Hyperlipidemia    Hypertension    Personal history of radiation therapy 2000   Right Breast Cancer    Past Surgical History:  Procedure Laterality Date   BREAST LUMPECTOMY Right 2000   BREAST SURGERY     2000-Lumpectomy   CHOLECYSTECTOMY     1996   MASTECTOMY W/ SENTINEL NODE BIOPSY Right 04/06/2021   Procedure: RIGHT MASTECTOMY WITH SENTINEL LYMPH NODE BIOPSY;  Surgeon: Jovita Kussmaul, MD;  Location: [place];  Service: General;  Laterality: Right;    There were no vitals filed for this visit.    Subjective Assessment - 04/29/21 1304     Subjective I am feeling pretty good. I just have one spot that feels tight.    Pertinent History 2000- R lumpectomy; 04/06/21- R mastectomy and SLNB (0/2) DCIS    Patient Stated Goals I don't really know because I did not know I needed therapy    Currently in Pain? No/denies                [facility] PT Assessment - 04/29/21 0001       Assessment   Medical Diagnosis R breast cancer    Referring Provider (PT) Marlou Starks    Onset Date/Surgical Date 04/06/21    Hand Dominance Right    Prior Therapy none      Precautions   Precautions Other (comment)    Precaution Comments at risk of lymphedema       Restrictions   Weight Bearing Restrictions No      Balance Screen   Has the patient fallen in the past 6 months No    Has the patient had a decrease in activity level because of a fear of falling?  No    Is the patient reluctant to leave their home because of a fear of falling?  No      Home Ecologist residence    Living Arrangements Spouse/significant other    Available Help at Discharge Family    Type of [place]      Prior Function   Level of [place] Retired    Leisure pt reports she tries to walk for exercise, but works in the yard and does housework      Cognition   Overall Cognitive Status Within Functional Limits for tasks assessed      AROM   Right Shoulder Extension 90 Degrees    Right Shoulder Flexion 165 Degrees    Right Shoulder ABduction 170 Degrees    Right Shoulder Internal Rotation 60 Degrees  Right Shoulder External Rotation 100 Degrees    Left Shoulder Extension 68 Degrees    Left Shoulder Flexion 170 Degrees    Left Shoulder ABduction 171 Degrees    Left Shoulder Internal Rotation 58 Degrees    Left Shoulder External Rotation 102 Degrees               LYMPHEDEMA/ONCOLOGY QUESTIONNAIRE - 04/29/21 0001       Type   Cancer Type right breast cancer      Surgeries   Mastectomy Date 04/06/21    Lumpectomy Date 04/06/21    Sentinel Lymph Node Biopsy Date 04/06/21    Number Lymph Nodes Removed 2      Treatment   Active Chemotherapy Treatment No    Past Chemotherapy Treatment No    Active Radiation Treatment No    Past Radiation Treatment Yes    Date 04/07/99    Current Hormone Treatment No    Past Hormone Therapy Yes    Drug Name Tamoxifen      Lymphedema Assessments   Lymphedema Assessments Upper extremities      Right Upper Extremity Lymphedema   15 cm Proximal to Olecranon Process 34.6 cm    Olecranon Process 28.8 cm    15 cm Proximal to Ulnar Styloid Process 26.2 cm     Just Proximal to Ulnar Styloid Process 17 cm    Across Hand at [employer] 19.5 cm    At [place] of 2nd Digit 6 cm      Left Upper Extremity Lymphedema   15 cm Proximal to Olecranon Process 37.5 cm    Olecranon Process 27.7 cm    15 cm Proximal to Ulnar Styloid Process 26.2 cm    Just Proximal to Ulnar Styloid Process 16.3 cm    Across Hand at [employer] 20 cm    At [place] of 2nd Digit 6 cm                     Objective measurements completed on examination: See above findings.               PT Education - 04/29/21 1338     Education Details anatomy and physiology of lymphatic system, lymphedema risk reduction practices, ABC class, scar mobilization    Person(s) Educated Patient    Methods Explanation;Handout    Comprehension Verbalized understanding                  Breast Clinic Goals - 04/29/21 1347       Patient will be able to verbalize understanding of pertinent lymphedema risk reduction practices relevant to her diagnosis specifically related to skin care.   Time 1    Period Days    Status Achieved      Patient will be able to return demonstrate and/or verbalize understanding of the post-op home exercise program related to regaining shoulder range of motion.   Baseline pt has full ROM    Status Deferred      Patient will be able to verbalize understanding of the importance of attending the postoperative After Breast Cancer Class for further lymphedema risk reduction education and therapeutic exercise.   Time 1    Period Days    Status Achieved                   Plan - 04/29/21 1339     Clinical Impression Statement Pt reports to PT after recent R mastectomy and  SLNB 0/2 for treatment of R breast DCIS on 04/06/21. She had a previous history of R breast cancer and a R lumpectomy followed by radiation and anti estrogen therapy in 2000. Measured bilateral shoulder ROM and pt has regained full R shoulder ROM. Also took  circumference measurements of bilateral UEs and pt does not demonstrate any signs or symptoms of lymphedema. Educated pt about lymphedema risk reduction practices, anatomy and physiology of lymphatic system and ABC class. Her mastectomy scar is healing. Educated pt that once her scar is completely healed how to do scar mobilization.  At this time pt will be discharged from skilled PT services at this time as she has no other skilled needs.    Stability/Clinical Decision Making Stable/Uncomplicated    Clinical Decision Making Low    Rehab Potential Good    PT Frequency One time visit    PT Treatment/Interventions ADLs/Self Care Home Management;Patient/family education;Therapeutic exercise    PT Next Visit Plan d/c this visit    Consulted and Agree with Plan of Care Patient             Patient will benefit from skilled therapeutic intervention in order to improve the following deficits and impairments:  Postural dysfunction, Decreased skin integrity, Decreased scar mobility, Decreased knowledge of precautions  Visit Diagnosis: Abnormal posture  Intraductal carcinoma in situ of right breast     Problem List Patient Active Problem List   Diagnosis Date Noted   Cancer of right female breast ([place]) 04/06/2021   Ductal carcinoma in situ (DCIS) of right breast 03/17/2021   Obese 12/02/2014   Prediabetes 12/02/2014   Breast cancer ([place]) 09/25/2009   COLONIC POLYPS 09/25/2009   Vitamin D deficiency 09/25/2009   HYPERCHOLESTEROLEMIA 09/25/2009   ESSENTIAL HYPERTENSION, BENIGN 09/25/2009    Allyson Sabal [facility] 04/29/2021, 1:48 PM  [place], [place], [address] Phone: [phone]   Fax:  [phone]  Name: Nicole Meyers MRN: [id number] Date of Birth: 1956/07/29  Manus Gunning, PT 04/29/21 1:48 PM

## 2021-05-18 ENCOUNTER — Encounter: Payer: Self-pay | Admitting: Hematology and Oncology

## 2021-05-18 ENCOUNTER — Encounter: Payer: Self-pay | Admitting: *Deleted

## 2021-05-18 ENCOUNTER — Inpatient Hospital Stay: Payer: Medicare Other | Attending: Hematology and Oncology | Admitting: Hematology and Oncology

## 2021-05-18 ENCOUNTER — Other Ambulatory Visit: Payer: Self-pay

## 2021-05-18 VITALS — BP 100/71 | HR 91 | Temp 97.7°F | Resp 18 | Wt 190.0 lb

## 2021-05-18 DIAGNOSIS — Z923 Personal history of irradiation: Secondary | ICD-10-CM | POA: Insufficient documentation

## 2021-05-18 DIAGNOSIS — Z170 Estrogen receptor positive status [ER+]: Secondary | ICD-10-CM | POA: Insufficient documentation

## 2021-05-18 DIAGNOSIS — D0511 Intraductal carcinoma in situ of right breast: Secondary | ICD-10-CM

## 2021-05-18 DIAGNOSIS — I10 Essential (primary) hypertension: Secondary | ICD-10-CM | POA: Insufficient documentation

## 2021-05-18 DIAGNOSIS — Z9011 Acquired absence of right breast and nipple: Secondary | ICD-10-CM | POA: Diagnosis not present

## 2021-05-18 DIAGNOSIS — E785 Hyperlipidemia, unspecified: Secondary | ICD-10-CM | POA: Diagnosis not present

## 2021-05-18 DIAGNOSIS — Z79811 Long term (current) use of aromatase inhibitors: Secondary | ICD-10-CM | POA: Diagnosis not present

## 2021-05-18 DIAGNOSIS — Z79899 Other long term (current) drug therapy: Secondary | ICD-10-CM | POA: Insufficient documentation

## 2021-05-18 MED ORDER — ANASTROZOLE 1 MG PO TABS: 1.0000 mg | ORAL_TABLET | Freq: Every day | ORAL | 2 refills | Status: DC

## 2021-05-18 NOTE — Progress Notes (Signed)
[facility] NOTE  Patient Care Team: Banga, Bonnee Quin, DO as PCP - General (Obstetrics and Gynecology) Rockwell Germany, RN as Oncology Nurse Navigator Mauro Kaufmann, RN as Oncology Nurse Navigator  CHIEF COMPLAINTS/PURPOSE OF CONSULTATION:  DCIS right breast  ASSESSMENT & PLAN:   Ductal carcinoma in situ (DCIS) of right breast This is a very pleasant 65 year old female patient with past medical history significant for right breast DCIS status postlumpectomy, radiation and adjuvant tamoxifen for 5 years now presents with abnormal screening mammogram findings, biopsy confirmed intermediate grade DCIS, ER +90% strong staining, PR +30%, moderate staining and is status post right mastectomy. No role for adjuvant radiation. We have once again discussed about adjuvant endocrine therapy, mechanism of action of tamoxifen versus aromatase inhibitors.  With aromatase inhibitors, we have discussed about postmenopausal symptoms, arthralgias/myalgias, worsening bone density and increased risk of cardiovascular events as primary side effects. We have discussed about monitoring bone density every 2 years, repeat 1 ordered in November 2022.  She is keen to try Arimidex, prescription has been sent to the pharmacy of her choice.  She will return to clinic in about 3 months for toxicity check on Arimidex.  If she is tolerating it well.  She can continue follow-up twice a year.  She will get left breast mammogram annually as indicated.  Orders Placed This Encounter  Procedures   DG Bone Density    Standing Status:   Future    Standing Expiration Date:   05/18/2022    Order Specific Question:   Reason for Exam (SYMPTOM  OR DIAGNOSIS REQUIRED)    Answer:   screening for osteoporosis    Order Specific Question:   Preferred imaging location?    [facility]      HISTORY OF PRESENTING ILLNESS:   Nicole Meyers 65 y.o. female is here because of DCIS  This is a  very lovely 65 year old female patient with past medical history significant for DCIS in the right breast about 22 years ago treated with lumpectomy, radiation and tamoxifen who was found to have some abnormal cluster of calcifications on her routine screening mammogram. Most recent mammogram in June 2022 which showed right breast calcifications which warranted further evaluation.  She then had diagnostic mammogram and stereotactic biopsy was recommended. Right breast needle core biopsy in the upper outer quadrant showed intermediate grade ductal carcinoma in situ with necrosis and calcifications, ER +90% strong staining, PR +30% moderate staining Since last visit she had right mastectomy with deep right axillary sentinel lymph node biopsy on April 06, 2021 by Dr. Marlou Starks Pathology from right breast mastectomy showed high-grade DCIS with necrosis, low-grade DCIS, negative margins.  2 sentinel lymph nodes removed negative for carcinoma. Since she had mastectomy, there is no additional role for radiation.  She is here to discuss adjuvant endocrine therapy She has been recovering very well since her mastectomy.  No complaints today.  She has been doing exercises as recommended by physical therapy.  She is interested in proceeding with adjuvant endocrine therapy.  Rest of the pertinent 10 point ROS reviewed and negative.  REVIEW OF SYSTEMS:   Constitutional: Denies fevers, chills or abnormal night sweats Eyes: Denies blurriness of vision, double vision or watery eyes Ears, nose, mouth, throat, and face: Denies mucositis or sore throat Respiratory: Denies cough, dyspnea or wheezes Cardiovascular: Denies palpitation, chest discomfort or lower extremity swelling Gastrointestinal:  Denies nausea, heartburn or change in bowel habits Skin: Denies abnormal  skin rashes Lymphatics: Denies new lymphadenopathy or easy bruising Neurological:Denies numbness, tingling or new weaknesses Behavioral/Psych: Mood is  stable, no new changes  All other systems were reviewed with the patient and are negative.  MEDICAL HISTORY:  Past Medical History:  Diagnosis Date   Breast cancer ([place]) 2000   Right Breast Cancer   Cancer ([place])    Breast   Hyperlipidemia    Hypertension    Personal history of radiation therapy 2000   Right Breast Cancer    SURGICAL HISTORY: Past Surgical History:  Procedure Laterality Date   BREAST LUMPECTOMY Right 2000   BREAST SURGERY     2000-Lumpectomy   CHOLECYSTECTOMY     1996   MASTECTOMY W/ SENTINEL NODE BIOPSY Right 04/06/2021   Procedure: RIGHT MASTECTOMY WITH SENTINEL LYMPH NODE BIOPSY;  Surgeon: Jovita Kussmaul, MD;  Location: [address];  Service: General;  Laterality: Right;    SOCIAL HISTORY: Social History   Socioeconomic History   Marital status: Married    Spouse name: Not on file   Number of children: Not on file   Years of education: Not on file   Highest education level: Not on file  Occupational History   Occupation: school administration     Comment: [employer]  Tobacco Use   Smoking status: Never   Smokeless tobacco: Never  Vaping Use   Vaping Use: Never used  Substance and Sexual Activity   Alcohol use: Yes    Alcohol/week: 1.0 standard drink    Types: 1 Standard drinks or equivalent per week   Drug use: No   Sexual activity: Never    Partners: Male  Other Topics Concern   Not on file  Social History Narrative   Married   2 female kids grown   Social Determinants of Health   Financial Resource Strain: Not on file  Food Insecurity: Not on file  Transportation Needs: Not on file  Physical Activity: Not on file  Stress: Not on file  Social Connections: Not on file  Intimate Partner Violence: Not on file    FAMILY HISTORY: Family History  Problem Relation Age of Onset   Hypertension Father    Cancer Father 20       prostate   Diabetes Mother    Hypertension Sister    Hypertension Brother     Hypertension Sister    Hypertension Sister    Hypertension Brother    Diabetes Maternal Grandmother    Breast cancer Neg Hx     ALLERGIES:  is allergic to no known allergies.  MEDICATIONS:  Current Outpatient Medications  Medication Sig Dispense Refill   anastrozole (ARIMIDEX) 1 MG tablet Take 1 tablet (1 mg total) by mouth daily. 30 tablet 2   calcium carbonate (OS-CAL) 600 MG TABS tablet Take 600 mg by mouth daily with breakfast.     CHIA SEED PO Take by mouth daily.     Cholecalciferol (VITAMIN D3) 2000 UNITS TABS Take 1 tablet by mouth daily.     HYDROcodone-acetaminophen (NORCO/VICODIN) 5-325 MG tablet Take 1-2 tablets by mouth every 6 (six) hours as needed for moderate pain. 15 tablet 0   methocarbamol (ROBAXIN) 500 MG tablet Take 1 tablet (500 mg total) by mouth every 6 (six) hours as needed for muscle spasms. 30 tablet 2   Multiple Vitamin (MULTIVITAMIN) tablet Take 1 tablet by mouth daily.     Omega-3 Fatty Acids (OMEGA 3 PO) Take 1 capsule by mouth daily.  potassium gluconate 595 MG TABS tablet Take 595 mg by mouth daily.     Thiamine HCl (VITAMIN B-1) 250 MG tablet Take 250 mg by mouth daily.     vitamin E 400 UNIT capsule Take 400 Units by mouth daily.     No current facility-administered medications for this visit.   PHYSICAL EXAMINATION: ECOG PERFORMANCE STATUS: 0 - Asymptomatic  Vitals:   05/18/21 0951  BP: 100/71  Pulse: 91  Resp: 18  Temp: 97.7 F (36.5 C)  SpO2: 95%    Filed Weights   05/18/21 0951  Weight: 190 lb (86.2 kg)    Breasts: Status post right mastectomy.  Mastectomy scar and drain site appears to have healed well.  LABORATORY DATA:  I have reviewed the data as listed Lab Results  Component Value Date   HGB 14.4 07/21/2009   HCT 42.9 07/21/2009   MCV 88 07/21/2009   PLT 316 07/21/2009     Chemistry      Component Value Date/Time   NA 138 12/02/2014 0935   K 4.6 12/02/2014 0935   CL 103 12/02/2014 0935   CO2 29 12/02/2014 0935    BUN 19 12/02/2014 0935   CREATININE 0.87 12/02/2014 0935      Component Value Date/Time   CALCIUM 10.1 12/02/2014 0935   ALKPHOS 76 12/02/2014 0935   AST 15 12/02/2014 0935   ALT 15 12/02/2014 0935   BILITOT 0.4 12/02/2014 0935       RADIOGRAPHIC STUDIES: I have personally reviewed the radiological images as listed and agreed with the findings in the report. No results found.  All questions were answered. The patient knows to call the clinic with any problems, questions or concerns. I spent 30 minutes in the care of this patient including H and P, review of records, counseling and coordination of care. We have discussed the mastectomy report, role of adjuvant endocrine therapy, mechanism of action of tamoxifen versus aromatase inhibitors, adverse effects of aromatase inhibitors as well as need to monitor bone density.  We have reviewed her bone density report from 2020 and ordered repeat one in November 2022    Benay Pike, MD 05/18/2021 11:07 AM

## 2021-05-18 NOTE — Assessment & Plan Note (Signed)
This is a very pleasant 65 year old female patient with past medical history significant for right breast DCIS status postlumpectomy, radiation and adjuvant tamoxifen for 5 years now presents with abnormal screening mammogram findings, biopsy confirmed intermediate grade DCIS, ER +90% strong staining, PR +30%, moderate staining and is status post right mastectomy. No role for adjuvant radiation. We have once again discussed about adjuvant endocrine therapy, mechanism of action of tamoxifen versus aromatase inhibitors.  With aromatase inhibitors, we have discussed about postmenopausal symptoms, arthralgias/myalgias, worsening bone density and increased risk of cardiovascular events as primary side effects. We have discussed about monitoring bone density every 2 years, repeat 1 ordered in November 2022.  She is keen to try Arimidex, prescription has been sent to the pharmacy of her choice.  She will return to clinic in about 3 months for toxicity check on Arimidex.  If she is tolerating it well.  She can continue follow-up twice a year.  She will get left breast mammogram annually as indicated.

## 2021-06-04 ENCOUNTER — Telehealth: Payer: Self-pay | Admitting: *Deleted

## 2021-06-07 ENCOUNTER — Telehealth: Payer: Self-pay | Admitting: *Deleted

## 2021-06-08 DIAGNOSIS — Z1239 Encounter for other screening for malignant neoplasm of breast: Secondary | ICD-10-CM | POA: Diagnosis not present

## 2021-06-08 DIAGNOSIS — C50911 Malignant neoplasm of unspecified site of right female breast: Secondary | ICD-10-CM | POA: Diagnosis not present

## 2021-06-08 DIAGNOSIS — R030 Elevated blood-pressure reading, without diagnosis of hypertension: Secondary | ICD-10-CM | POA: Diagnosis not present

## 2021-08-25 ENCOUNTER — Other Ambulatory Visit: Payer: Self-pay | Admitting: Hematology and Oncology

## 2021-08-31 ENCOUNTER — Encounter: Payer: Self-pay | Admitting: Hematology and Oncology

## 2021-08-31 ENCOUNTER — Inpatient Hospital Stay: Payer: Medicare Other | Attending: Hematology and Oncology | Admitting: Hematology and Oncology

## 2021-08-31 ENCOUNTER — Other Ambulatory Visit: Payer: Self-pay

## 2021-08-31 VITALS — BP 161/91 | HR 88 | Temp 98.0°F | Resp 18 | Ht 65.0 in | Wt 194.5 lb

## 2021-08-31 DIAGNOSIS — D0511 Intraductal carcinoma in situ of right breast: Secondary | ICD-10-CM | POA: Diagnosis not present

## 2021-08-31 DIAGNOSIS — Z79811 Long term (current) use of aromatase inhibitors: Secondary | ICD-10-CM | POA: Insufficient documentation

## 2021-08-31 DIAGNOSIS — Z170 Estrogen receptor positive status [ER+]: Secondary | ICD-10-CM | POA: Diagnosis not present

## 2021-08-31 NOTE — Assessment & Plan Note (Signed)
This is a very pleasant 65 year old female patient with past medical history significant for right breast DCIS status postlumpectomy, radiation and adjuvant tamoxifen for 5 years now presents with abnormal screening mammogram findings, biopsy confirmed intermediate grade DCIS, ER +90% strong staining, PR +30%, moderate staining and is status post right mastectomy. No role for adjuvant radiation. We recommended adjuvant endocrine therapy, mechanism of action of tamoxifen versus aromatase inhibitors.  With aromatase inhibitors, we have discussed about postmenopausal symptoms, arthralgias/myalgias, worsening bone density and increased risk of cardiovascular events as primary side effects.  She was started on Arimidex, initially had some hot flashes, joint pains but these have resolved. She is now tolerating Arimidex very well and is here for toxicity check.  No concerns.  Physical examination unremarkable.  She is due for left breast mammogram in June 2023 which has been ordered. Bone density scheduled for March 2023.  Encouraged her to continue self breast exam, calcium, vitamin D supplements, return to clinic in 6 months.

## 2021-08-31 NOTE — Progress Notes (Signed)
[name] NOTE  Patient Care Team: [place], Bonnee Quin, DO as PCP - General (Obstetrics and Gynecology) Rockwell Germany, RN as Oncology Nurse Navigator Mauro Kaufmann, RN as Oncology Nurse Navigator  CHIEF COMPLAINTS/PURPOSE OF CONSULTATION:  DCIS right breast  ASSESSMENT & PLAN:   Ductal carcinoma in situ (DCIS) of right breast This is a very pleasant 65 year old female patient with past medical history significant for right breast DCIS status postlumpectomy, radiation and adjuvant tamoxifen for 5 years now presents with abnormal screening mammogram findings, biopsy confirmed intermediate grade DCIS, ER +90% strong staining, PR +30%, moderate staining and is status post right mastectomy. No role for adjuvant radiation. We recommended adjuvant endocrine therapy, mechanism of action of tamoxifen versus aromatase inhibitors.  With aromatase inhibitors, we have discussed about postmenopausal symptoms, arthralgias/myalgias, worsening bone density and increased risk of cardiovascular events as primary side effects.  She was started on Arimidex, initially had some hot flashes, joint pains but these have resolved. She is now tolerating Arimidex very well and is here for toxicity check.  No concerns.  Physical examination unremarkable.  She is due for left breast mammogram in June 2023 which has been ordered. Bone density scheduled for March 2023.  Encouraged her to continue self breast exam, calcium, vitamin D supplements, return to clinic in 6 months.  Weightbearing exercises recommended for bone strength.  Orders Placed This Encounter  Procedures   MM Digital Screening Unilat L    Standing Status:   Future    Standing Expiration Date:   08/31/2022    Order Specific Question:   Reason for Exam (SYMPTOM  OR DIAGNOSIS REQUIRED)    Answer:   screening mammo    Order Specific Question:   Preferred imaging location?    Answer:   [facility]       HISTORY OF PRESENTING ILLNESS:   Nicole Meyers 65 y.o. female is here because of DCIS.  This is a very lovely 65 year old female patient with past medical history significant for DCIS in the right breast about 22 years ago treated with lumpectomy, radiation and tamoxifen who was found to have some abnormal cluster of calcifications on her routine screening mammogram. Most recent mammogram in June 2022 which showed right breast calcifications which warranted further evaluation.  She then had diagnostic mammogram and stereotactic biopsy was recommended. Right breast needle core biopsy in the upper outer quadrant showed intermediate grade ductal carcinoma in situ with necrosis and calcifications, ER +90% strong staining, PR +30% moderate staining She had right mastectomy with deep right axillary sentinel lymph node biopsy on April 06, 2021 by Dr. Marlou Starks Pathology from right breast mastectomy showed high-grade DCIS with necrosis, low-grade DCIS, negative margins.  2 sentinel lymph nodes removed negative for carcinoma. Since she had mastectomy, there is no additional role for radiation.  She was started on adjuvant Arimidex for endocrine therapy  Interval history  Patient is here for follow-up.  She has been doing really well on Arimidex.  She initially had hot flashes and stiff joints but her symptoms have resolved.  She absolutely denies any complaints.  She has been checking her left breast and reports no masses or nipple changes. No interim hospitalizations or new medications.  She takes calcium and vitamin D supplements.  Bone density scan scheduled for March 2023.  Rest of the pertinent 10 point ROS reviewed and negative.  MEDICAL HISTORY:  Past Medical History:  Diagnosis Date   Breast cancer (  [place]) 2000   Right Breast Cancer   Cancer ([place])    Breast   Hyperlipidemia    Hypertension    Personal history of radiation therapy 2000   Right Breast Cancer    SURGICAL  HISTORY: Past Surgical History:  Procedure Laterality Date   BREAST LUMPECTOMY Right 2000   BREAST SURGERY     2000-Lumpectomy   CHOLECYSTECTOMY     1996   MASTECTOMY W/ SENTINEL NODE BIOPSY Right 04/06/2021   Procedure: RIGHT MASTECTOMY WITH SENTINEL LYMPH NODE BIOPSY;  Surgeon: Jovita Kussmaul, MD;  Location: [facility];  Service: General;  Laterality: Right;    SOCIAL HISTORY: Social History   Socioeconomic History   Marital status: Married    Spouse name: Not on file   Number of children: Not on file   Years of education: Not on file   Highest education level: Not on file  Occupational History   Occupation: school administration     Comment: [employer]  Tobacco Use   Smoking status: Never   Smokeless tobacco: Never  Vaping Use   Vaping Use: Never used  Substance and Sexual Activity   Alcohol use: Yes    Alcohol/week: 1.0 standard drink    Types: 1 Standard drinks or equivalent per week   Drug use: No   Sexual activity: Never    Partners: Male  Other Topics Concern   Not on file  Social History Narrative   Married   2 female kids grown   Social Determinants of Health   Financial Resource Strain: Not on file  Food Insecurity: Not on file  Transportation Needs: Not on file  Physical Activity: Not on file  Stress: Not on file  Social Connections: Not on file  Intimate Partner Violence: Not on file    FAMILY HISTORY: Family History  Problem Relation Age of Onset   Hypertension Father    Cancer Father 49       prostate   Diabetes Mother    Hypertension Sister    Hypertension Brother    Hypertension Sister    Hypertension Sister    Hypertension Brother    Diabetes Maternal Grandmother    Breast cancer Neg Hx     ALLERGIES:  is allergic to no known allergies.  MEDICATIONS:  Current Outpatient Medications  Medication Sig Dispense Refill   anastrozole (ARIMIDEX) 1 MG tablet TAKE 1 TABLET(1 MG) BY MOUTH DAILY 30 tablet 2   calcium  carbonate (OS-CAL) 600 MG TABS tablet Take 600 mg by mouth daily with breakfast.     CHIA SEED PO Take by mouth daily.     Cholecalciferol (VITAMIN D3) 2000 UNITS TABS Take 1 tablet by mouth daily.     Multiple Vitamin (MULTIVITAMIN) tablet Take 1 tablet by mouth daily.     Omega-3 Fatty Acids (OMEGA 3 PO) Take 1 capsule by mouth daily.     potassium gluconate 595 MG TABS tablet Take 595 mg by mouth daily.     Thiamine HCl (VITAMIN B-1) 250 MG tablet Take 250 mg by mouth daily.     vitamin E 400 UNIT capsule Take 400 Units by mouth daily.     No current facility-administered medications for this visit.   PHYSICAL EXAMINATION: ECOG PERFORMANCE STATUS: 0 - Asymptomatic  Vitals:   08/31/21 0947  BP: (!) 161/91  Pulse: 88  Resp: 18  Temp: 98 F (36.7 C)  SpO2: 100%    Filed Weights   08/31/21 0947  Weight: 194 lb 8 oz (88.2 kg)    Breasts: Status post right mastectomy.  Mastectomy scar and drain site appears to have healed well. Left breast appears normal to inspection and palpation.  No regional adenopathy No bilateral lower extremity edema  LABORATORY DATA:  I have reviewed the data as listed Lab Results  Component Value Date   HGB 14.4 07/21/2009   HCT 42.9 07/21/2009   MCV 88 07/21/2009   PLT 316 07/21/2009     Chemistry      Component Value Date/Time   NA 138 12/02/2014 0935   K 4.6 12/02/2014 0935   CL 103 12/02/2014 0935   CO2 29 12/02/2014 0935   BUN 19 12/02/2014 0935   CREATININE 0.87 12/02/2014 0935      Component Value Date/Time   CALCIUM 10.1 12/02/2014 0935   ALKPHOS 76 12/02/2014 0935   AST 15 12/02/2014 0935   ALT 15 12/02/2014 0935   BILITOT 0.4 12/02/2014 0935       RADIOGRAPHIC STUDIES: I have personally reviewed the radiological images as listed and agreed with the findings in the report. No results found.  All questions were answered. The patient knows to call the clinic with any problems, questions or concerns. I spent 20 minutes in  the care of this patient including history, physical, review of records, counseling and coordination of care.  Benay Pike, MD 08/31/2021 1:10 PM

## 2021-09-01 ENCOUNTER — Inpatient Hospital Stay: Payer: Medicare Other | Admitting: Hematology and Oncology

## 2021-09-02 ENCOUNTER — Telehealth: Payer: Self-pay | Admitting: *Deleted

## 2021-09-14 ENCOUNTER — Other Ambulatory Visit: Payer: Self-pay

## 2021-09-14 ENCOUNTER — Encounter: Payer: Self-pay | Admitting: *Deleted

## 2021-09-14 ENCOUNTER — Inpatient Hospital Stay: Payer: Medicare Other | Attending: Adult Health | Admitting: *Deleted

## 2021-09-14 DIAGNOSIS — D0511 Intraductal carcinoma in situ of right breast: Secondary | ICD-10-CM

## 2021-09-14 NOTE — Progress Notes (Signed)
2 Identifiers used for verification purposes only. No vital signs taken being this was a telephone visit. Pt is doing well. Pt denies pain and no complaints of fatigue. Pt started anastrozole on Sept.15, 2022. She has hot flashes randomly and said her hair was thinning in the beginning when she started the medication. No complaints of joint pain nor vaginal dryness or wetness. Pt doesn't get much exercise but I encouraged her to at least walk 30 minutes a day 5 times a week based on the recommendation that studies have shown. She said she will comply. Last bone density was in 2020. She is scheduled for bone density in March and mammo in June. Vaccinations are up to date. Overall pt is pleased with how she is doing. RTC in June to see Dr. Chryl Heck.

## 2021-11-17 ENCOUNTER — Ambulatory Visit
Admission: RE | Admit: 2021-11-17 | Discharge: 2021-11-17 | Disposition: A | Discharge status: Home / Self Care | Payer: Medicare Other | Source: Ambulatory Visit | Attending: Hematology and Oncology | Admitting: Hematology and Oncology

## 2021-11-17 DIAGNOSIS — Z780 Asymptomatic menopausal state: Secondary | ICD-10-CM | POA: Diagnosis not present

## 2021-11-17 DIAGNOSIS — D0511 Intraductal carcinoma in situ of right breast: Secondary | ICD-10-CM | POA: Diagnosis not present

## 2021-11-29 ENCOUNTER — Other Ambulatory Visit: Payer: Self-pay | Admitting: Hematology and Oncology

## 2021-12-03 DIAGNOSIS — Z7689 Persons encountering health services in other specified circumstances: Secondary | ICD-10-CM | POA: Diagnosis not present

## 2021-12-03 DIAGNOSIS — Z79811 Long term (current) use of aromatase inhibitors: Secondary | ICD-10-CM | POA: Diagnosis not present

## 2022-02-09 DIAGNOSIS — H40053 Ocular hypertension, bilateral: Secondary | ICD-10-CM | POA: Diagnosis not present

## 2022-02-09 DIAGNOSIS — H40013 Open angle with borderline findings, low risk, bilateral: Secondary | ICD-10-CM | POA: Diagnosis not present

## 2022-02-09 DIAGNOSIS — H40003 Preglaucoma, unspecified, bilateral: Secondary | ICD-10-CM | POA: Diagnosis not present

## 2022-02-09 DIAGNOSIS — H2513 Age-related nuclear cataract, bilateral: Secondary | ICD-10-CM | POA: Diagnosis not present

## 2022-02-09 DIAGNOSIS — H524 Presbyopia: Secondary | ICD-10-CM | POA: Diagnosis not present

## 2022-03-01 ENCOUNTER — Other Ambulatory Visit: Payer: Self-pay | Admitting: Hematology and Oncology

## 2022-03-01 ENCOUNTER — Ambulatory Visit
Admission: RE | Admit: 2022-03-01 | Discharge: 2022-03-01 | Disposition: A | Discharge status: Home / Self Care | Payer: Medicare Other | Source: Ambulatory Visit | Attending: Hematology and Oncology | Admitting: Hematology and Oncology

## 2022-03-01 ENCOUNTER — Encounter: Payer: Self-pay | Admitting: Hematology and Oncology

## 2022-03-01 ENCOUNTER — Inpatient Hospital Stay: Payer: Medicare Other | Attending: Hematology and Oncology | Admitting: Hematology and Oncology

## 2022-03-01 ENCOUNTER — Other Ambulatory Visit: Payer: Self-pay

## 2022-03-01 DIAGNOSIS — R030 Elevated blood-pressure reading, without diagnosis of hypertension: Secondary | ICD-10-CM | POA: Diagnosis not present

## 2022-03-01 DIAGNOSIS — D0511 Intraductal carcinoma in situ of right breast: Secondary | ICD-10-CM | POA: Diagnosis not present

## 2022-03-01 DIAGNOSIS — Z86000 Personal history of in-situ neoplasm of breast: Secondary | ICD-10-CM | POA: Insufficient documentation

## 2022-03-01 DIAGNOSIS — Z170 Estrogen receptor positive status [ER+]: Secondary | ICD-10-CM | POA: Diagnosis not present

## 2022-03-01 DIAGNOSIS — Z9011 Acquired absence of right breast and nipple: Secondary | ICD-10-CM | POA: Insufficient documentation

## 2022-03-01 DIAGNOSIS — Z79811 Long term (current) use of aromatase inhibitors: Secondary | ICD-10-CM | POA: Diagnosis not present

## 2022-03-01 DIAGNOSIS — M2560 Stiffness of unspecified joint, not elsewhere classified: Secondary | ICD-10-CM | POA: Diagnosis not present

## 2022-03-01 DIAGNOSIS — Z923 Personal history of irradiation: Secondary | ICD-10-CM | POA: Diagnosis not present

## 2022-03-01 DIAGNOSIS — Z1231 Encounter for screening mammogram for malignant neoplasm of breast: Secondary | ICD-10-CM | POA: Diagnosis not present

## 2022-03-01 NOTE — Progress Notes (Signed)
Sorry about that [facility] CONSULT NOTE  Patient Care Team: Bang[place], Sharol Given, DO as PCP - General (Obstetrics and Gynecology) Donnelly Angelica, RN as Oncology Nurse Navigator Lu Duffel, Margretta Ditty, RN as Oncology Nurse Navigator Rachel Moulds, MD as Consulting Physician (Hematology and Oncology) Axel Filler, Larna Daughters, NP as Nurse Practitioner (Hematology and Oncology) Maryclare Labrador, RN as Registered Nurse Griselda Miner, MD as Consulting Physician (General Surgery)  CHIEF COMPLAINTS/PURPOSE OF CONSULTATION:  DCIS right breast  ASSESSMENT & PLAN:   Ductal carcinoma in situ (DCIS) of right breast This is a very pleasant 66 year old female patient with past medical history significant for right breast DCIS status postlumpectomy, radiation and adjuvant tamoxifen for 5 years now presents with abnormal screening mammogram findings, biopsy confirmed intermediate grade DCIS, ER +90% strong staining, PR +30%, moderate staining and is status post right mastectomy. No role for adjuvant radiation.  She is tolerating anastrozole well and is here for toxicity check.  She has noticed some joint stiffness especially in the mornings and also increased blood pressure since she started anastrozole.  I have asked her to make a BP log for the next 6 to 8 weeks and return to clinic.  She understands that we may have to start her on an antihypertensive if her blood pressure continues to range in 160 systolic.  Other option would be to try a different antiestrogen therapy.  She is agreeable to this recommendation.  She was also encouraged to call us sooner if she has any other complaints or concerns. Last bone density normal, no evidence of osteopenia or osteoporosis.  She continues on calcium/vitamin D supplementation and I have encouraged some weightbearing exercises.    Weightbearing exercises recommended for bone strength.  No orders of the defined types were placed in this  encounter.     HISTORY OF PRESENTING ILLNESS:   Nicole Meyers 66 y.o. female is here because of DCIS.  This is a very lovely 66 year old female patient with past medical history significant for DCIS in the right breast about 22 years ago treated with lumpectomy, radiation and tamoxifen who was found to have some abnormal cluster of calcifications on her routine screening mammogram. Most recent mammogram in June 2022 which showed right breast calcifications which warranted further evaluation.  She then had diagnostic mammogram and stereotactic biopsy was recommended. Right breast needle core biopsy in the upper outer quadrant showed intermediate grade ductal carcinoma in situ with necrosis and calcifications, ER +90% strong staining, PR +30% moderate staining She had right mastectomy with deep right axillary sentinel lymph node biopsy on April 06, 2021 by Dr. Carolynne Edouard Pathology from right breast mastectomy showed high-grade DCIS with necrosis, low-grade DCIS, negative margins.  2 sentinel lymph nodes removed negative for carcinoma. Since she had mastectomy, there is no additional role for radiation.  She was started on adjuvant Arimidex for endocrine therapy  Interval history  Patient is here for follow-up.  She has been doing really well on Arimidex.   She has noticed morning stiffness in the joints, and has noted some increase in BP. NO hot flashes or vaginal dryness. Prior to taking anastrozole, her blood pressure was usually in 120s to 130s systolic.  In the past 2 visits, she has noticed that her blood pressure is a bit higher.  She is not on any antihypertensives at this time  Rest of the pertinent 10 point ROS reviewed and negative.  MEDICAL HISTORY:  Past Medical History:  Diagnosis Date  Breast cancer ([facility]) 2000   Right Breast Cancer   Cancer ([facility])    Breast   Hyperlipidemia    Hypertension    Personal history of radiation therapy 2000   Right Breast Cancer    SURGICAL  HISTORY: Past Surgical History:  Procedure Laterality Date   BREAST LUMPECTOMY Right 2000   BREAST SURGERY     2000-Lumpectomy   CHOLECYSTECTOMY     1996   MASTECTOMY W/ SENTINEL NODE BIOPSY Right 04/06/2021   Procedure: RIGHT MASTECTOMY WITH SENTINEL LYMPH NODE BIOPSY;  Surgeon: Griselda Miner, MD;  Location: [facility];  Service: General;  Laterality: Right;    SOCIAL HISTORY: Social History   Socioeconomic History   Marital status: Married    Spouse name: Not on file   Number of children: Not on file   Years of education: Not on file   Highest education level: Not on file  Occupational History   Occupation: school administration     Comment: [employer]  Tobacco Use   Smoking status: Never   Smokeless tobacco: Never  Vaping Use   Vaping Use: Never used  Substance and Sexual Activity   Alcohol use: Yes    Alcohol/week: 1.0 standard drink of alcohol    Types: 1 Standard drinks or equivalent per week   Drug use: No   Sexual activity: Never    Partners: Male  Other Topics Concern   Not on file  Social History Narrative   Married   2 female kids grown   Social Determinants of Health   Financial Resource Strain: Low Risk  (09/14/2021)   Overall Financial Resource Strain (CARDIA)    Difficulty of Paying Living Expenses: Not hard at all  Food Insecurity: No Food Insecurity (09/14/2021)   Hunger Vital Sign    Worried About Running Out of Food in the Last Year: Never true    Ran Out of Food in the Last Year: Never true  Transportation Needs: No Transportation Needs (09/14/2021)   PRAPARE - Administrator, Civil Service (Medical): No    Lack of Transportation (Non-Medical): No  Physical Activity: Insufficiently Active (09/14/2021)   Exercise Vital Sign    Days of Exercise per Week: 2 days    Minutes of Exercise per Session: 20 min  Stress: No Stress Concern Present (09/14/2021)   [employer] of Occupational Health - Occupational Stress  Questionnaire    Feeling of Stress : Not at all  Social Connections: Moderately Integrated (09/14/2021)   Social Connection and Isolation Panel [NHANES]    Frequency of Communication with Friends and Family: Three times a week    Frequency of Social Gatherings with Friends and Family: Three times a week    Attends Religious Services: 1 to 4 times per year    Active Member of Clubs or Organizations: No    Attends Banker Meetings: Never    Marital Status: Married  Catering manager Violence: Not At Risk (09/14/2021)   Humiliation, Afraid, Rape, and Kick questionnaire    Fear of Current or Ex-Partner: No    Emotionally Abused: No    Physically Abused: No    Sexually Abused: No    FAMILY HISTORY: Family History  Problem Relation Age of Onset   Hypertension Father    Cancer Father 34       prostate   Diabetes Mother    Hypertension Sister    Hypertension Brother    Hypertension Sister  Hypertension Sister    Hypertension Brother    Diabetes Maternal Grandmother    Breast cancer Neg Hx     ALLERGIES:  is allergic to no known allergies.  MEDICATIONS:  Current Outpatient Medications  Medication Sig Dispense Refill   anastrozole (ARIMIDEX) 1 MG tablet TAKE 1 TABLET(1 MG) BY MOUTH DAILY 30 tablet 2   calcium carbonate (OS-CAL) 600 MG TABS tablet Take 600 mg by mouth daily with breakfast.     CHIA SEED PO Take by mouth daily.     Cholecalciferol (VITAMIN D3) 2000 UNITS TABS Take 1 tablet by mouth daily.     Multiple Vitamin (MULTIVITAMIN) tablet Take 1 tablet by mouth daily.     Omega-3 Fatty Acids (OMEGA 3 PO) Take 1 capsule by mouth daily.     potassium gluconate 595 MG TABS tablet Take 595 mg by mouth daily. (Patient not taking: Reported on 09/14/2021)     Thiamine HCl (VITAMIN B-1) 250 MG tablet Take 250 mg by mouth daily.     vitamin E 400 UNIT capsule Take 400 Units by mouth daily.     No current facility-administered medications for this visit.   PHYSICAL  EXAMINATION: ECOG PERFORMANCE STATUS: 0 - Asymptomatic  Vitals:   03/01/22 0941  BP: (!) 167/97  Pulse: 77  Resp: 17  Temp: (!) 97.3 F (36.3 C)  SpO2: 98%     Filed Weights   03/01/22 0941  Weight: 193 lb 7 oz (87.7 kg)    Physical Exam Constitutional:      Appearance: Normal appearance.  Chest:     Comments: Right breast mastectomy.  Left breast normal to inspection and palpation.  No regional adenopathy Musculoskeletal:     Cervical back: Normal range of motion and neck supple. No rigidity.  Lymphadenopathy:     Cervical: No cervical adenopathy.  Neurological:     Mental Status: She is alert.      LABORATORY DATA:  I have reviewed the data as listed Lab Results  Component Value Date   HGB 14.4 07/21/2009   HCT 42.9 07/21/2009   MCV 88 07/21/2009   PLT 316 07/21/2009     Chemistry      Component Value Date/Time   NA 138 12/02/2014 0935   K 4.6 12/02/2014 0935   CL 103 12/02/2014 0935   CO2 29 12/02/2014 0935   BUN 19 12/02/2014 0935   CREATININE 0.87 12/02/2014 0935      Component Value Date/Time   CALCIUM 10.1 12/02/2014 0935   ALKPHOS 76 12/02/2014 0935   AST 15 12/02/2014 0935   ALT 15 12/02/2014 0935   BILITOT 0.4 12/02/2014 0935       RADIOGRAPHIC STUDIES: I have personally reviewed the radiological images as listed and agreed with the findings in the report. No results found.  All questions were answered. The patient knows to call the clinic with any problems, questions or concerns.  Total time spent: 30 minutes including history, physical exam, review of records, counseling and coordination of care  Rachel Moulds, MD 03/01/2022 10:10 AM

## 2022-03-07 DIAGNOSIS — Z0000 Encounter for general adult medical examination without abnormal findings: Secondary | ICD-10-CM | POA: Diagnosis not present

## 2022-03-07 DIAGNOSIS — Z136 Encounter for screening for cardiovascular disorders: Secondary | ICD-10-CM | POA: Diagnosis not present

## 2022-04-19 ENCOUNTER — Other Ambulatory Visit: Payer: Self-pay

## 2022-04-19 ENCOUNTER — Inpatient Hospital Stay: Payer: Medicare Other | Attending: Hematology and Oncology | Admitting: Hematology and Oncology

## 2022-04-19 ENCOUNTER — Encounter: Payer: Self-pay | Admitting: Hematology and Oncology

## 2022-04-19 DIAGNOSIS — D0511 Intraductal carcinoma in situ of right breast: Secondary | ICD-10-CM | POA: Insufficient documentation

## 2022-04-19 DIAGNOSIS — Z79811 Long term (current) use of aromatase inhibitors: Secondary | ICD-10-CM | POA: Insufficient documentation

## 2022-04-19 DIAGNOSIS — Z170 Estrogen receptor positive status [ER+]: Secondary | ICD-10-CM | POA: Insufficient documentation

## 2022-04-19 NOTE — Assessment & Plan Note (Signed)
This is a very pleasant 66 year old female patient with past medical history significant for right breast DCIS status postlumpectomy, radiation and adjuvant tamoxifen for 5 years now presents with abnormal screening mammogram findings, biopsy confirmed intermediate grade DCIS, ER +90% strong staining, PR +30%, moderate staining and is status post right mastectomy. No role for adjuvant radiation.  She is tolerating anastrozole well and is here for toxicity check.  She has some muscle stiffness and joint stiffness from anastrozole, otherwise tolerating the medication very well.  Very intermittent hot flashes which are not bothersome at all.  She denies any new breast changes.  Most recent mammogram in June unremarkable.  Physical examination today without any concerns. Baseline bone density in March 2023, normal, no evidence of osteopenia or osteoporosis.  We once again discussed about regular exercise, vitamin D supplementation and calcium as tolerated. She will return to clinic in 6 months or sooner as needed.  Next mammogram in June 2024.

## 2022-04-19 NOTE — Progress Notes (Signed)
Sorry about that cone [place] NOTE  Patient Care Team: Banga, Bonnee Quin, DO as PCP - General (Obstetrics and Gynecology) Rockwell Germany, RN as Oncology Nurse Navigator Tressie Ellis, Paulette Blanch, RN as Oncology Nurse Navigator Benay Pike, MD as Consulting Physician (Hematology and Oncology) Delice Bison, Charlestine Massed, NP as Nurse Practitioner (Hematology and Oncology) Harmon Pier, RN as Registered Nurse Jovita Kussmaul, MD as Consulting Physician (General Surgery)  CHIEF COMPLAINTS/PURPOSE OF CONSULTATION:   DCIS right breast  ASSESSMENT & PLAN:   Ductal carcinoma in situ (DCIS) of right breast This is a very pleasant 66 year old female patient with past medical history significant for right breast DCIS status postlumpectomy, radiation and adjuvant tamoxifen for 5 years now presents with abnormal screening mammogram findings, biopsy confirmed intermediate grade DCIS, ER +90% strong staining, PR +30%, moderate staining and is status post right mastectomy. No role for adjuvant radiation.  She is tolerating anastrozole well and is here for toxicity check.  She has some muscle stiffness and joint stiffness from anastrozole, otherwise tolerating the medication very well.  Very intermittent hot flashes which are not bothersome at all.  She denies any new breast changes.  Most recent mammogram in June unremarkable.  Physical examination today without any concerns. Baseline bone density in March 2023, normal, no evidence of osteopenia or osteoporosis.  We once again discussed about regular exercise, vitamin D supplementation and calcium as tolerated. She will return to clinic in 6 months or sooner as needed.  Next mammogram in June 2024.   Weightbearing exercises recommended for bone strength.  No orders of the defined types were placed in this encounter.   HISTORY OF PRESENTING ILLNESS:   Nicole Meyers 66 y.o. female is here because of DCIS.  This is a very lovely  66 year old female patient with past medical history significant for DCIS in the right breast about 22 years ago treated with lumpectomy, radiation and tamoxifen who was found to have some abnormal cluster of calcifications on her routine screening mammogram. Most recent mammogram in June 2022 which showed right breast calcifications which warranted further evaluation.  She then had diagnostic mammogram and stereotactic biopsy was recommended. Right breast needle core biopsy in the upper outer quadrant showed intermediate grade ductal carcinoma in situ with necrosis and calcifications, ER +90% strong staining, PR +30% moderate staining She had right mastectomy with deep right axillary sentinel lymph node biopsy on April 06, 2021 by Dr. Marlou Starks Pathology from right breast mastectomy showed high-grade DCIS with necrosis, low-grade DCIS, negative margins.  2 sentinel lymph nodes removed negative for carcinoma. Since she had mastectomy, there is no additional role for radiation.  She was started on adjuvant Arimidex for endocrine therapy  Interval history  Patient is here for follow-up.  She has been doing really well on Arimidex.   She continues to have some joint stiffness but tolerable.  Very intermittent hot flashes.  She otherwise denies any side effects with anastrozole.  She mentions that she has been tolerating this very well overall.  She had her last mammogram in June 2023 which was without any concerns.  She had baseline bone density in March 2023 which was normal.  Rest of the pertinent 10 point ROS reviewed and negative.  MEDICAL HISTORY:  Past Medical History:  Diagnosis Date   Breast cancer ([place]) 2000   Right Breast Cancer   Cancer [facility])    Breast   Hyperlipidemia    Hypertension    Personal history of  radiation therapy 2000   Right Breast Cancer    SURGICAL HISTORY: Past Surgical History:  Procedure Laterality Date   BREAST BIOPSY Left    BREAST LUMPECTOMY Right 2000    BREAST SURGERY     2000-Lumpectomy   CHOLECYSTECTOMY     1996   MASTECTOMY Right 04/2021   MASTECTOMY W/ SENTINEL NODE BIOPSY Right 04/06/2021   Procedure: RIGHT MASTECTOMY WITH SENTINEL LYMPH NODE BIOPSY;  Surgeon: Jovita Kussmaul, MD;  Location: [facility];  Service: General;  Laterality: Right;    SOCIAL HISTORY: Social History   Socioeconomic History   Marital status: Married    Spouse name: Not on file   Number of children: Not on file   Years of education: Not on file   Highest education level: Not on file  Occupational History   Occupation: school administration     Comment: [employer]  Tobacco Use   Smoking status: Never   Smokeless tobacco: Never  Vaping Use   Vaping Use: Never used  Substance and Sexual Activity   Alcohol use: Yes    Alcohol/week: 1.0 standard drink of alcohol    Types: 1 Standard drinks or equivalent per week   Drug use: No   Sexual activity: Never    Partners: Male  Other Topics Concern   Not on file  Social History Narrative   Married   2 female kids grown   Social Determinants of Health   Financial Resource Strain: Low Risk  (09/14/2021)   Overall Financial Resource Strain (CARDIA)    Difficulty of Paying Living Expenses: Not hard at all  Food Insecurity: No Food Insecurity (09/14/2021)   Hunger Vital Sign    Worried About Running Out of Food in the Last Year: Never true    Ran Out of Food in the Last Year: Never true  Transportation Needs: No Transportation Needs (09/14/2021)   PRAPARE - Hydrologist (Medical): No    Lack of Transportation (Non-Medical): No  Physical Activity: Insufficiently Active (09/14/2021)   Exercise Vital Sign    Days of Exercise per Week: 2 days    Minutes of Exercise per Session: 20 min  Stress: No Stress Concern Present (09/14/2021)   [place]    Feeling of Stress : Not at all  Social  Connections: Moderately Integrated (09/14/2021)   Social Connection and Isolation Panel [NHANES]    Frequency of Communication with Friends and Family: Three times a week    Frequency of Social Gatherings with Friends and Family: Three times a week    Attends Religious Services: 1 to 4 times per year    Active Member of Clubs or Organizations: No    Attends Archivist Meetings: Never    Marital Status: Married  Human resources officer Violence: Not At Risk (09/14/2021)   Humiliation, Afraid, Rape, and Kick questionnaire    Fear of Current or Ex-Partner: No    Emotionally Abused: No    Physically Abused: No    Sexually Abused: No    FAMILY HISTORY: Family History  Problem Relation Age of Onset   Hypertension Father    Cancer Father 84       prostate   Diabetes Mother    Hypertension Sister    Hypertension Brother    Hypertension Sister    Hypertension Sister    Hypertension Brother    Diabetes Maternal Grandmother    Breast cancer  Neg Hx     ALLERGIES:  is allergic to no known allergies.  MEDICATIONS:  Current Outpatient Medications  Medication Sig Dispense Refill   anastrozole (ARIMIDEX) 1 MG tablet TAKE 1 TABLET(1 MG) BY MOUTH DAILY 30 tablet 2   calcium carbonate (OS-CAL) 600 MG TABS tablet Take 600 mg by mouth daily with breakfast.     CHIA SEED PO Take by mouth daily.     Cholecalciferol (VITAMIN D3) 2000 UNITS TABS Take 1 tablet by mouth daily.     Multiple Vitamin (MULTIVITAMIN) tablet Take 1 tablet by mouth daily.     Omega-3 Fatty Acids (OMEGA 3 PO) Take 1 capsule by mouth daily.     potassium gluconate 595 MG TABS tablet Take 595 mg by mouth daily. (Patient not taking: Reported on 09/14/2021)     Thiamine HCl (VITAMIN B-1) 250 MG tablet Take 250 mg by mouth daily.     vitamin E 400 UNIT capsule Take 400 Units by mouth daily.     No current facility-administered medications for this visit.   PHYSICAL EXAMINATION: ECOG PERFORMANCE STATUS: 0 -  Asymptomatic  Vitals:   04/19/22 1156  BP: (!) 172/86  Pulse: 98  Resp: 18  Temp: 97.7 F (36.5 C)  SpO2: 98%      Filed Weights   04/19/22 1156  Weight: 193 lb 3.2 oz (87.6 kg)     Physical Exam Constitutional:      Appearance: Normal appearance.  Chest:     Comments: Right breast mastectomy.  Left breast normal to inspection and palpation.  No regional adenopathy Musculoskeletal:     Cervical back: Normal range of motion and neck supple. No rigidity.  Lymphadenopathy:     Cervical: No cervical adenopathy.  Neurological:     Mental Status: She is alert.      LABORATORY DATA:  I have reviewed the data as listed Lab Results  Component Value Date   HGB 14.4 07/21/2009   HCT 42.9 07/21/2009   MCV 88 07/21/2009   PLT 316 07/21/2009     Chemistry      Component Value Date/Time   NA 138 12/02/2014 0935   K 4.6 12/02/2014 0935   CL 103 12/02/2014 0935   CO2 29 12/02/2014 0935   BUN 19 12/02/2014 0935   CREATININE 0.87 12/02/2014 0935      Component Value Date/Time   CALCIUM 10.1 12/02/2014 0935   ALKPHOS 76 12/02/2014 0935   AST 15 12/02/2014 0935   ALT 15 12/02/2014 0935   BILITOT 0.4 12/02/2014 0935       RADIOGRAPHIC STUDIES: I have personally reviewed the radiological images as listed and agreed with the findings in the report. No results found.  All questions were answered. The patient knows to call the clinic with any problems, questions or concerns.  Total time spent: 20 minutes including history, physical exam, review of records, counseling and coordination of care  Benay Pike, MD 04/19/2022 12:40 PM

## 2022-05-18 ENCOUNTER — Ambulatory Visit: Payer: Medicare Other | Admitting: Hematology and Oncology

## 2022-06-06 ENCOUNTER — Other Ambulatory Visit: Payer: Self-pay | Admitting: Hematology and Oncology

## 2022-06-23 DIAGNOSIS — Z853 Personal history of malignant neoplasm of breast: Secondary | ICD-10-CM | POA: Diagnosis not present

## 2022-09-09 ENCOUNTER — Other Ambulatory Visit: Payer: Self-pay | Admitting: Hematology and Oncology

## 2022-10-20 ENCOUNTER — Inpatient Hospital Stay: Payer: Medicare Other | Admitting: Hematology and Oncology

## 2022-11-08 ENCOUNTER — Inpatient Hospital Stay: Payer: Medicare Other | Attending: Hematology and Oncology | Admitting: Hematology and Oncology

## 2022-11-08 ENCOUNTER — Encounter: Payer: Self-pay | Admitting: Hematology and Oncology

## 2022-11-08 VITALS — BP 171/54 | HR 96 | Temp 97.9°F | Resp 16 | Ht 65.0 in | Wt 189.0 lb

## 2022-11-08 DIAGNOSIS — Z170 Estrogen receptor positive status [ER+]: Secondary | ICD-10-CM | POA: Diagnosis not present

## 2022-11-08 DIAGNOSIS — D0511 Intraductal carcinoma in situ of right breast: Secondary | ICD-10-CM | POA: Insufficient documentation

## 2022-11-08 DIAGNOSIS — Z79811 Long term (current) use of aromatase inhibitors: Secondary | ICD-10-CM | POA: Diagnosis not present

## 2022-11-08 NOTE — Progress Notes (Signed)
Sorry about that cone [place] NOTE  Patient Care Team: Sherlyn Hay, DO as PCP - General (Obstetrics and Gynecology) Rockwell Germany, RN as Oncology Nurse Navigator Tressie Ellis, Paulette Blanch, RN as Oncology Nurse Navigator Benay Pike, MD as Consulting Physician (Hematology and Oncology) Delice Bison, Charlestine Massed, NP as Nurse Practitioner (Hematology and Oncology) Harmon Pier, RN as Registered Nurse Jovita Kussmaul, MD as Consulting Physician (General Surgery)  CHIEF COMPLAINTS/PURPOSE OF CONSULTATION:   DCIS right breast  ASSESSMENT & PLAN:   No problem-specific Assessment & Plan notes found for this encounter.   Weightbearing exercises recommended for bone strength.  No orders of the defined types were placed in this encounter.   HISTORY OF PRESENTING ILLNESS:   Nicole Meyers 67 y.o. female is here because of DCIS.  This is a very lovely 67 year old female patient with past medical history significant for DCIS in the right breast about 22 years ago treated with lumpectomy, radiation and tamoxifen who was found to have some abnormal cluster of calcifications on her routine screening mammogram. Most recent mammogram in June 2022 which showed right breast calcifications which warranted further evaluation.  She then had diagnostic mammogram and stereotactic biopsy was recommended. Right breast needle core biopsy in the upper outer quadrant showed intermediate grade ductal carcinoma in situ with necrosis and calcifications, ER +90% strong staining, PR +30% moderate staining She had right mastectomy with deep right axillary sentinel lymph node biopsy on April 06, 2021 by Dr. Marlou Starks Pathology from right breast mastectomy showed high-grade DCIS with necrosis, low-grade DCIS, negative margins.  2 sentinel lymph nodes removed negative for carcinoma. Since she had mastectomy, there is no additional role for radiation.  She was started on adjuvant Arimidex for  endocrine therapy  Interval history  Patient is here for follow-up.  She has been doing really well on Arimidex.   Hot flashes and joint stiffness are not bothersome. She is moving to [employer] of the pertinent 10 point ROS reviewed and negative.  MEDICAL HISTORY:  Past Medical History:  Diagnosis Date  . Breast cancer ([place]) 2000   Right Breast Cancer  . Cancer ([facility])    Breast  . Hyperlipidemia   . Hypertension   . Personal history of radiation therapy 2000   Right Breast Cancer    SURGICAL HISTORY: Past Surgical History:  Procedure Laterality Date  . BREAST BIOPSY Left   . BREAST LUMPECTOMY Right 2000  . BREAST SURGERY     2000-Lumpectomy  . CHOLECYSTECTOMY     1996  . MASTECTOMY Right 04/2021  . MASTECTOMY W/ SENTINEL NODE BIOPSY Right 04/06/2021   Procedure: RIGHT MASTECTOMY WITH SENTINEL LYMPH NODE BIOPSY;  Surgeon: Jovita Kussmaul, MD;  Location: [facility];  Service: General;  Laterality: Right;    SOCIAL HISTORY: Social History   Socioeconomic History  . Marital status: Married    Spouse name: Not on file  . Number of children: Not on file  . Years of education: Not on file  . Highest education level: Not on file  Occupational History  . Occupation: school administration     Comment: [employer]  Tobacco Use  . Smoking status: Never  . Smokeless tobacco: Never  Vaping Use  . Vaping Use: Never used  Substance and Sexual Activity  . Alcohol use: Yes    Alcohol/week: 1.0 standard drink of alcohol    Types: 1 Standard drinks or equivalent per week  . Drug use:  No  . Sexual activity: Never    Partners: Male  Other Topics Concern  . Not on file  Social History Narrative   Married   2 female kids grown   Social Determinants of Health   Financial Resource Strain: Low Risk  (09/14/2021)   Overall Financial Resource Strain (CARDIA)   . Difficulty of Paying Living Expenses: Not hard at all  Food Insecurity: No Food Insecurity  (09/14/2021)   Hunger Vital Sign   . Worried About Charity fundraiser in the Last Year: Never true   . Ran Out of Food in the Last Year: Never true  Transportation Needs: No Transportation Needs (09/14/2021)   PRAPARE - Transportation   . Lack of Transportation (Medical): No   . Lack of Transportation (Non-Medical): No  Physical Activity: Insufficiently Active (09/14/2021)   Exercise Vital Sign   . Days of Exercise per Week: 2 days   . Minutes of Exercise per Session: 20 min  Stress: No Stress Concern Present (09/14/2021)   [place]   . Feeling of Stress : Not at all  Social Connections: Moderately Integrated (09/14/2021)   Social Connection and Isolation Panel [NHANES]   . Frequency of Communication with Friends and Family: Three times a week   . Frequency of Social Gatherings with Friends and Family: Three times a week   . Attends Religious Services: 1 to 4 times per year   . Active Member of Clubs or Organizations: No   . Attends Archivist Meetings: Never   . Marital Status: Married  Human resources officer Violence: Not At Risk (09/14/2021)   Humiliation, Afraid, Rape, and Kick questionnaire   . Fear of Current or Ex-Partner: No   . Emotionally Abused: No   . Physically Abused: No   . Sexually Abused: No    FAMILY HISTORY: Family History  Problem Relation Age of Onset  . Hypertension Father   . Cancer Father 27       prostate  . Diabetes Mother   . Hypertension Sister   . Hypertension Brother   . Hypertension Sister   . Hypertension Sister   . Hypertension Brother   . Diabetes Maternal Grandmother   . Breast cancer Neg Hx     ALLERGIES:  is allergic to no known allergies.  MEDICATIONS:  Current Outpatient Medications  Medication Sig Dispense Refill  . anastrozole (ARIMIDEX) 1 MG tablet TAKE 1 TABLET(1 MG) BY MOUTH DAILY 30 tablet 2  . calcium carbonate (OS-CAL) 600 MG TABS tablet Take 600 mg by  mouth daily with breakfast.    . CHIA SEED PO Take by mouth daily.    . Cholecalciferol (VITAMIN D3) 2000 UNITS TABS Take 1 tablet by mouth daily.    . Multiple Vitamin (MULTIVITAMIN) tablet Take 1 tablet by mouth daily.    . Omega-3 Fatty Acids (OMEGA 3 PO) Take 1 capsule by mouth daily.    . potassium gluconate 595 MG TABS tablet Take 595 mg by mouth daily. (Patient not taking: Reported on 09/14/2021)    . Thiamine HCl (VITAMIN B-1) 250 MG tablet Take 250 mg by mouth daily.    . vitamin E 400 UNIT capsule Take 400 Units by mouth daily.     No current facility-administered medications for this visit.   PHYSICAL EXAMINATION: ECOG PERFORMANCE STATUS: 0 - Asymptomatic  Vitals:   11/08/22 0901  BP: (!) 171/54  Pulse: 96  Resp: 16  Temp: 97.9 F (  36.6 C)  SpO2: 98%      Filed Weights   11/08/22 0901  Weight: 189 lb (85.7 kg)     Physical Exam Constitutional:      Appearance: Normal appearance.  Chest:     Comments: Right breast mastectomy.  Left breast normal to inspection and palpation.  No regional adenopathy Musculoskeletal:     Cervical back: Normal range of motion and neck supple. No rigidity.  Lymphadenopathy:     Cervical: No cervical adenopathy.  Neurological:     Mental Status: She is alert.     LABORATORY DATA:  I have reviewed the data as listed Lab Results  Component Value Date   HGB 14.4 07/21/2009   HCT 42.9 07/21/2009   MCV 88 07/21/2009   PLT 316 07/21/2009     Chemistry      Component Value Date/Time   NA 138 12/02/2014 0935   K 4.6 12/02/2014 0935   CL 103 12/02/2014 0935   CO2 29 12/02/2014 0935   BUN 19 12/02/2014 0935   CREATININE 0.87 12/02/2014 0935      Component Value Date/Time   CALCIUM 10.1 12/02/2014 0935   ALKPHOS 76 12/02/2014 0935   AST 15 12/02/2014 0935   ALT 15 12/02/2014 0935   BILITOT 0.4 12/02/2014 0935       RADIOGRAPHIC STUDIES: I have personally reviewed the radiological images as listed and agreed with the  findings in the report. No results found.  All questions were answered. The patient knows to call the clinic with any problems, questions or concerns.  Total time spent: 20 minutes including history, physical exam, review of records, counseling and coordination of care  Benay Pike, MD 11/08/2022 9:13 AM

## 2022-11-16 ENCOUNTER — Ambulatory Visit: Payer: Medicare Other | Admitting: Hematology and Oncology
